# Patient Record
Sex: Female | Born: 1950 | Race: White | Hispanic: No | Marital: Married
Health system: Midwestern US, Academic
[De-identification: ages and names within clinical notes are randomized; demographics above are authoritative.]

---

## 2016-05-31 MED ORDER — FENTANYL CITRATE (PF) 50 MCG/ML IJ SOLN
25-50 ug | Freq: Once | INTRAVENOUS | 0 refills | Status: CP
Start: 2016-05-31 — End: ?

## 2016-05-31 MED ORDER — MIDAZOLAM 1 MG/ML IJ SOLN
1 mg | Freq: Once | INTRAVENOUS | 0 refills | Status: CP
Start: 2016-05-31 — End: ?

## 2016-05-31 MED ORDER — MIDAZOLAM 1 MG/ML IJ SOLN
0 refills | Status: CP
Start: 2016-05-31 — End: ?

## 2016-05-31 MED ORDER — FENTANYL CITRATE (PF) 50 MCG/ML IJ SOLN
0 refills | Status: CP
Start: 2016-05-31 — End: ?

## 2016-06-01 MED ORDER — ESOMEPRAZOLE MAGNESIUM 40 MG PO CPDR
ORAL_CAPSULE | Freq: Two times a day (BID) | 0 refills | 30.00000 days | Status: DC
Start: 2016-06-01 — End: 2016-06-29

## 2016-06-29 MED ORDER — ESOMEPRAZOLE MAGNESIUM 40 MG PO CPDR
ORAL_CAPSULE | Freq: Two times a day (BID) | 0 refills | 90.00000 days | Status: AC
Start: 2016-06-29 — End: ?

## 2016-08-11 ENCOUNTER — Encounter: Admit: 2016-08-11 | Discharge: 2016-08-11 | Payer: MEDICARE

## 2016-08-11 ENCOUNTER — Ambulatory Visit: Admit: 2016-08-11 | Discharge: 2016-08-12

## 2016-08-11 DIAGNOSIS — M329 Systemic lupus erythematosus, unspecified: Principal | ICD-10-CM

## 2016-08-11 DIAGNOSIS — K769 Liver disease, unspecified: ICD-10-CM

## 2016-08-11 DIAGNOSIS — M797 Fibromyalgia: ICD-10-CM

## 2016-08-11 NOTE — Telephone Encounter
Pt called to say she did not receive bariatric surgery info.

## 2016-08-11 NOTE — Progress Notes
Liver Transplant/Hepatology Clinic Nutrition Referral Note                                                     MD referral received on 08/11/16 for medical nutrition therapy services.    Met with pt in clinic.    Pt reported:    Transplant Liver Nutrition Questionnaire  Weight: 97.8 kg (215 lb 9.6 oz)  Have you recently lost weight without trying?: No  Have you noticed a decrease in muscle mass?: No  Have you been eating poorly due to decreased appetite?: No  Do you have any of the following conditions?: Overweight/Obesity    66 yo F with PMH of obesity, dermatitis, NAFLD per biopsy, diagnosed previously with lupus at OSH however [place] rheum has found negative work up for this. Pt also states she has a "sleep disorder".  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 RD was asked to meet with pt to discuss general healthy diet. She states she only eats once per day, but then with further questioning she admits that she eats crackers constantly throughout the day due to nausea and eats snacks such as cookie and pie. Her meals tend to be high calorie foods. Her nausea is likely driven by taking narcotic pain medications. Reviewed general healthy diet, encouraged 1/2 plate of non starchy vegetables, 1/4 plate of high fiber starches, 1/4 plate of lean protein. Encouraged water, coffee or unsweetened tea for drinks. Encouraged pt to avoid snacking on crackers all day, encouraged 3 well balanced meals daily. Pt voiced understanding, however she had a lot of barriers to making changes.  Time Spent with Patient (minutes): 15 minutes    Goals: reduced kcal intake, increased exercise.    Thornell Sartorius, MS, RD, LD  [phone]

## 2016-08-11 NOTE — Progress Notes
Date of Service: 08/11/2016    Subjective:             Holly Dixon is a 66 y.o. female.    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  She previously has a liver biopsy that showed evidence of sarcoidosis but this is not seen in the current liver biopsy.  Patient's also has other forms of dermatitis and has been followed by Dr. Berdie Ogren and there is no evidence of SLE or connective tissue disease.  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  It is uncertain if she also has obstructive sleep apnea may have contributed to her fatigue.  Metabolic risk factor also includes hypertension.  She does not have diabetes.  I have discussed with her about our treatment plan.  She is really not a good treatment candidate for the regenerate trial which would involve obeticholic acid.  Patient is already having a nonspecific dermatitis and it would be inpossible to distinguish the side effect of obeticholic acid as pruritus versus her dermatitis.  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  However patient does not have diabetes and insurance would not have paid for it.  Therefore the remaining option would involve a gastric sleeve surgery.  Patient expressed interest.  She will attend the seminar of  [place] [facility] and follow-up with [place] in 3 months.  In the meantime we will have our nutritionist talked to her today about the choose [email or web address] diet.       Review of Systems      Objective:         ??? CALCIUM CARBONATE (CALCIUM 600 PO) Take 1,200 mg by mouth daily.   ??? chlorzoxazone(+) (PARAFON FORTE) 500 mg tablet Take 500 mg by mouth four times daily as needed for Muscle Cramps.   ??? cholecalciferol (VITAMIN D-3) 1,000 units tablet Take 1,000 Units by mouth daily. ??? desipramine(+) (NORPRAMIN) 25 mg tablet Take 25 mg by mouth twice daily.   ??? DOCOSAHEXANOIC ACID/EPA (FISH OIL PO) Take 1,000 mg by mouth daily.   ??? esomeprazole DR(+) (NEXIUM) 40 mg capsule TAKE 1 CAPSULE BY MOUTH TWICE DAILY BEFORE MEALS. TAKE ON AN EMPTY STOMACH AT LEAST 1 HOUR BEFORE OR 2 HOURS AFTER FOOD   ??? FOLIC ACID/MULTIVIT-MIN/LUTEIN (CENTRUM SILVER PO) Take  by mouth daily.   ??? furosemide (LASIX) 20 mg tablet Take 20 mg by mouth as Needed.   ??? HYDROcodone/acetaminophen(+) (NORCO) 10/325 mg tablet Take 1 Tab by mouth every 6 hours as needed for Pain   ??? methadone (DOLOPHINE; METHADOSE) 10 mg tablet Take 10 mg by mouth three times daily   ??? nystatin (MYCOSTATIN) 100,000 units/mL oral suspension Take 500,000 Units by mouth twice daily as needed for Oral Thrush.   ??? potassium chloride SR (K-DUR) 20 mEq tablet Take 20 mEq by mouth daily. Take with a meal and a full glass of water.   ??? promethazine (PHENERGAN) 25 mg tablet Take 25 mg by mouth every 6 hours as needed for Nausea or Vomiting.   ??? triamcinolone acetonide (KENALOG) 0.1 % topical cream Apply  topically to affected area twice daily.     Vitals:    08/11/16 1015   BP: 145/77   Pulse: 78   Resp: 20  Temp: 36.8 ???C (98.3 ???F)   TempSrc: Oral   SpO2: 96%   Weight: 97.8 kg (215 lb 9.6 oz)   Height: 162.6 cm (64)     Body mass index is 37.01 kg/m???.     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  She previously has a liver biopsy that showed evidence of sarcoidosis but this is not seen in the current liver biopsy.  Patient's also has other forms of dermatitis and has been followed by Dr. Berdie Ogren and there is no evidence of SLE or connective tissue disease.  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  It is uncertain if she also has obstructive sleep apnea may have contributed to her fatigue. Metabolic risk factor also includes hypertension.  She does not have diabetes.  I have discussed with her about our treatment plan.  She is really not a good treatment candidate for the regenerate trial which would involve obeticholic acid.  Patient is already having a nonspecific dermatitis and it would be inpossible to distinguish the side effect of obeticholic acid as pruritus versus her dermatitis.  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  However patient does not have diabetes and insurance would not have paid for it.  Therefore the remaining option would involve a gastric sleeve surgery.  Patient expressed interest.  She will attend the seminar of  [facility] and follow-up with [place] in 3 months.  In the meantime we will have our nutritionist talked to her today about the choose [email or web address] diet. 35 min discussions.

## 2016-08-12 DIAGNOSIS — K7581 Nonalcoholic steatohepatitis (NASH): Principal | ICD-10-CM

## 2016-08-14 ENCOUNTER — Encounter: Admit: 2016-08-14 | Discharge: 2016-08-14 | Payer: MEDICARE

## 2016-08-14 NOTE — Progress Notes
Lab orders from clinic visit 08/11/16 faxed to Dr. Doloris HallPankaj Gugnani at [phone].

## 2016-08-14 NOTE — Telephone Encounter
Call returned to patient and provided [facility] website.  Shawn StallMichelle Finian Helvey RN BSN

## 2016-11-03 ENCOUNTER — Encounter: Admit: 2016-11-03 | Discharge: 2016-11-03 | Payer: MEDICARE

## 2016-11-03 DIAGNOSIS — M797 Fibromyalgia: ICD-10-CM

## 2016-11-03 DIAGNOSIS — R748 Abnormal levels of other serum enzymes: Secondary | ICD-10-CM

## 2016-11-03 DIAGNOSIS — M329 Systemic lupus erythematosus, unspecified: Principal | ICD-10-CM

## 2016-11-03 DIAGNOSIS — K769 Liver disease, unspecified: ICD-10-CM

## 2016-11-03 NOTE — Progress Notes
Date of Service: 11/03/2016    Subjective:             Holly Dixon is a 66 y.o. female.    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   ???  Her initial history is outlined in consultation note of 05/18/2015 when she was seen at the request of Dr. Dorothey Baseman in hepatology clinic for history of lupus in the setting of liver disease.  As per initial consultation note:   The patient stated that while in [facility] about 7-8 years ago, she developed diffuse dermatitis over her face and extremities and trunk and felt sick and nauseous.??? She was found to have elevated liver enzymes.??? Apparently she was evaluated by gastrologist her hepatologist and her primary care physician.??? She was subsequently referred to rheumatologist Dr. Betti Cruz.??? Per patient's report, she also had skin biopsy that was abnormal and together with her labs she was diagnosed with lupus.??? She was started on prednisone which she did not tolerate well.??? She denies ever been on Plaquenil or chloroquine.  She was diagnosed with fibromyalgia with symptoms of mild diffuse myalgia and tenderness and fatigue which have persisted for this she was treated with Cymbalta and Lyrica which again she did not tolerated well and ended up hospital which may have affected her liver enzymes as well patient's recollection.??? She has been treated with hydrocodone and methadone to help with her fibromyalgia.??? This is being refilled by her primary care physician Dr. Hulan Fess.  It appears that somehow after several liver biopsies she may have thought of having possibly hepatic sarcoidosis was started on Imuran in January 2016.??? She has had ERCPs and pancreatitis.??? Patient is not sure about the details of her history in this regards.  She was seen by Dr. Dorothey Baseman in hepatology clinic in October 2016.??? There is no available labs  EMR but apparently patient has been getting monitoring labs for Imuran every 3 months through her primary care physician that are communicated with Dr. Dorothey Baseman.  She denies any depression or anxiety.??? She does not use any sunscreen.??? She has faint erythema confluently over her upper extremities and chest and face and appearance of malar rash.??? She has not seen a dermatologist for a while and after she moved to [facility] in 2016.  Patient denies any history of strokes, seizures, thromboembolic events, or miscarriages.??? No history of inflammatory eye disease or idiopathic hearing loss.??? No sicca or mucositis or dysphagia, abdominal pain, hematochezia, or melena.?????? No history of hemoptysis, cough, or current chest pain, or any known serositis. No history of nephritis or Raynaud's phenomenon.??? No associated rash or photosensitivity. No history of psoriasis or inflammatory bowel disease.??? No paresthesia, neuropathy, radiculopathy, or frank weakness.??? No history of fevers, night sweats or unintentional weight loss.  She denies any history of Tobacco or alcohol use.??? 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 There is no family history of connective tissue disease.  I reviewed the outside records that were scanned into EMR.??? ???  Liver biopsy November 26, 2006 showed noncaseating granulomatous inflammation and other stains were negative.??? The differential diagnosis included infectious agent, sarcoidosis versus a drug  reaction.??? ???  The TPMT level was normal in November 2015.  The notes of rheumatologist Dr. Betti Cruz included diagnoses of fibromyalgia, bursitis of hip, and elevated liver enzyme with negative ANA, liver sarcoidosis It appears that she was indeed on Plaquenil 2009 and early 2010 although the patient could not remember this.Marland Kitchen??? By 2012 her diagnoses included sarcoidosis, Raynaud's syndrome, bursitis of hip, fibromyalgia, and elevated liver enzymes.??? She was no longer on Plaquenil.??? She was on alendronate.??? By 2014 and 2015 she had developed erythema and facial erythema.??? It appears that by 2015 diagnosis of lupus was added to her list of diagnoses.??? One note of her current primary care physician in June 2016 listed diagnoses of chronic pain syndrome, fatty liver disease, nonalcoholic, chronic discoid lupus erythematosus, and elevated liver enzymes.  There was no skin biopsy report.  ??????  ??????  Interval History:    She developed a left pulmonary embolism in 09/2016 after a fall and injuring her left knee. She was a started on warfarin.  She has remained off of azathioprine for a long time as she couldn't tolerated previously.   She continues to complain of diffuse myalgia and arthralgia and tenderness and fatigue. ???This is a stable. The fibromyalgia is being managed by her primary care physician. ???  She has had diffuse paresthesia and tingling and burning sensation of her skin with associated pain chronically. ???  The chronic dermatitis mostly in her face and upper arms have remained stable. ???These apparently have not necessarily photosensitive.  She has had cyanotic or purplish discoloration of the fingers and toes but not necessarily cold induced.  She has had a chronic dyspnea. ???  No constitutional symptoms such as fever, night sweats or weight loss.   ???     Review of Systems   Constitutional: Positive for fatigue.   HENT: Positive for tinnitus.    Respiratory: Positive for shortness of breath.    Gastrointestinal: Positive for nausea.   Musculoskeletal: Positive for arthralgias, myalgias and neck pain.   Neurological: Positive for numbness.   All other systems reviewed and are negative.        Objective: ??? CALCIUM CARBONATE (CALCIUM 600 PO) Take 1,200 mg by mouth daily.   ??? chlorzoxazone(+) (PARAFON FORTE) 500 mg tablet Take 500 mg by mouth four times daily as needed for Muscle Cramps.   ??? cholecalciferol (VITAMIN D-3) 1,000 units tablet Take 1,000 Units by mouth daily.   ??? desipramine(+) (NORPRAMIN) 25 mg tablet Take 25 mg by mouth twice daily.   ??? DOCOSAHEXANOIC ACID/EPA (FISH OIL PO) Take 1,000 mg by mouth daily.   ??? esomeprazole DR(+) (NEXIUM) 40 mg capsule TAKE 1 CAPSULE BY MOUTH TWICE DAILY BEFORE MEALS. TAKE ON AN EMPTY STOMACH AT LEAST 1 HOUR BEFORE OR 2 HOURS AFTER FOOD   ??? FOLIC ACID/MULTIVIT-MIN/LUTEIN (CENTRUM SILVER PO) Take  by mouth daily.   ??? furosemide (LASIX) 20 mg tablet Take 20 mg by mouth as Needed.   ??? HYDROcodone/acetaminophen(+) (NORCO) 10/325 mg tablet Take 1 Tab by mouth every 6 hours as needed for Pain   ??? methadone (DOLOPHINE; METHADOSE) 10 mg tablet Take 10 mg by mouth three times daily   ??? nystatin (MYCOSTATIN) 100,000 units/mL oral suspension Take 500,000 Units by mouth twice daily as needed for Oral Thrush.   ??? potassium chloride SR (K-DUR) 20 mEq tablet Take 20 mEq by mouth daily. Take with a meal and a full glass of water.   ??? promethazine (PHENERGAN) 25 mg tablet Take 25  mg by mouth every 6 hours as needed for Nausea or Vomiting.   ??? triamcinolone acetonide (KENALOG) 0.1 % topical cream Apply  topically to affected area twice daily.   ??? warfarin (COUMADIN) 5 mg tablet Take 5 mg by mouth daily.     Vitals:    11/03/16 1017   BP: 140/75   Pulse: 77   Resp: 18   Temp: 36.9 ???C (98.5 ???F)   TempSrc: Oral   SpO2: 97%   Weight: 95.7 kg (211 lb)   Height: 162.6 cm (64)     Body mass index is 36.22 kg/m???. Discussed patient's BMI with her.  The body mass index is 36.22 kg/m???. and falls within the category of Obesity 2 (35 to <40); specialist visit only, referred back to Primary Care Provider for follow up.    Physical Exam   Constitutional: She does not appear ill. No distress.   HENT: Mouth/Throat: Oropharynx is clear and moist and mucous membranes are normal.   Eyes: Conjunctivae are normal.   Neck: Normal range of motion.   Cardiovascular: Normal rate, regular rhythm, normal heart sounds and intact distal pulses.    Pulmonary/Chest: Effort normal and breath sounds normal.   Musculoskeletal:   No synovitis of any joints upper and lower extremities bilaterally. No dactylitis. ROM of joints WFL.  She has diffuse soft tissue point tenderness of fibromyalgia bilaterally.     Lymphadenopathy:     She has no cervical adenopathy.   Neurological: She is alert. She has normal strength.   No weakness of the proximal and distal upper and lower extremities and the cervical muscles.   Skin: No rash noted.   She has facial erythema including maxillary rash that spares the nasolabial fold bilaterally.  The erythema is confluent and macular.  It involves her forehead less intensely.  She has more erythematous violaceous confluent macular dermatitis of arms bilaterally and mildly of trunk. No sclerodactyly or sclerosis elsewhere, telangectasia, or calcinosis. No heliotrope rash, Gottron's papules, Shawl or V sign.  No livedo reticularis. Nailfold capillaroscopy negative.  No digital ulcerations.   Psychiatric: She has a normal mood and affect. Her speech is normal and behavior is normal.            Assessment and Plan:    1. Fibromyalgia    2. Elevated liver enzymes    3. Chronic liver disease    4. Dermatitis    5. Eosinophilic esophagitis    6. Reflux esophagitis    7. Numbness and tingling sensation of skin    8. Chronic neck pain    9. Chronic fatigue    10. Peripheral cyanosis    11. Dyspnea, unspecified type      ???  ???    This has been a complex case of a chronic disease process with facial erythema suggestive of malar rash???which by biopsy did not appear to be related to underlying connective tissue disease but suggestive of sun damaged skin, as well as faint erythema of the trunk and upper extremities in the setting of prior diagnosis of SLE (although the details are not clear) and not confirmed by further evaluation at [facility], and chronic severe fibromyalgia or chronic pain disorder. It appears that she was previously on hydroxychloroquine???and later she was started on chloroquine.??? Per patient, she did not tolerate the prednisone either but the details of this are not clear.??? The patient did not describe other features of a connective tissue disease.  ???  She has had  liver disease with noncaseating granulomatous inflammation by pathology for which a diagnosis of hepatic sarcoidosis???was entertained and therefore she was started on azathioprine.??? ???  ???  The patient has had a chronic diagnosis of fibromyalgia.??? She has remained on pain medications and continues to have diffuse myalgia and tenderness and fatigue.??? She did not tolerate medications such as Cymbalta or Lyrica.  ???  I strongly advised her to follow son protection measures, avoid excessive sun exposure, apply sunscreen, and wear long sleeves and pants and hat.  ???  She has had dysphagia and occasional nausea and diarrhea chronically.    ???  She followed???in hepatology clinicto evaluate the underlying liver disease and any further management.  ???  She remainwd on azathioprine with monitoring labs done through her primary care physician, Dr. Hulan Fess and communicated with hepatology clinic. The patient discontinued the azathioprine apparently on her own as she could not tolerated it later.  ???  Labs from 05/18/15 showed mild thrombocytosis, mildly elevated alkaline phosphatase, Aldolase of 12.8, ANA 160, and urinalysis with leukocytosis but without proteinuria. The remainder of the labs including ???autoimmune serologies, ACE level and vitamin D and 1,25 vitamin D, Hepatitis B and C serologies, and TPMT are normal or negative.   ???  The chest x-ray 05/18/15 showed mild right hemidiaphragmatic elevation without acute cardiopulmonary process without report of hilar adenopathy.  ???  CBC in 4/20167 showed mild eosinophilia and otherwise was negative. ???She did not have eosinophilia on her CBC in March 2017. The CMP was negative with only mildly elevated alkaline phosphatase. ???She follows???in hepatology clinic and this can be addressed there by Dr. Shea Evans. ???The remainder of the liver enzymes were normal. ???Normal or negative TPO, and repeat aldolase and ANA. ???The urinalysis showed leukocytes esterases and WBCs but reflex urine culture was basically negative. ???  Negative Myomarker panel III.  As there were no clear evidence of myositis I did???not proceed with EMG.???  Therefore, her autoimmune serologies were not consistent with her outside diagnosis of SLE.   ???  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   I noted that the skin biopsy in dermatology clinic here was consistent with poikiloderma, etiology of which can be that of sun damage.  We received skin punch biopsy slides 05/08/13 and liver biopsy 05/09/2011 slides and reports. Reports were???scanned to [facility], slides taken to [facility] for confirmation of diagnosis.  ???  The outside dermatopathology report from 05/08/2013 done at [facility] showed the following:  Right nasal sidewall skin biopsy: Perivascular chronic inflammation with telangiectasias, no interface dermatitis identified, no spongiotic dermatitis identified, no fungus identified on special stain.  Direct immunofluorescence of right distal posterior upper arm skin: Nonspecific direct immunofluorescent findings with very small amount of IgM present dermoepidermal, junction non-confirmatory lupus, no evidence of immune mediated vesiculobolus disorder. ???  Comment: The histologic findings are mild and nonspecific with mild perivascular chronic inflammation and mild telangiectatic changes within blood vessels. ???No interface dermatitis, dense perifollicular inflammation, increased dermal mucin, epidermal atrophy, or other evidence of lupus is identified. ???Non-granulomatous rosacea might show these features. ???Some type of dermal hypersensitivity reaction should also be considered. ???Eczema and related conditions usually show more epidermal spongiosis.    Outside skin biopsy was reviewed at [facility]:   Right nasal sidewall: Photodamaged skin with epidermal atrophy and telangiectasias.   Comment: The biopsy is negative for the inflammatory interface dermatitis of an  autoimmune disease.   A second outside biopsy reviewed at [place] was that of liver:   Architecturally ???normal liver. Mild macrovesicular steatosis (20%). Mild focal portal nonspecific infiltrates of lymphocytes and scattered eosinophils.   Comment: These findings are nonspecific. No evidence of an active primary or secondary liver disease.     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 ???     So, in summary, the skin biopsy reports from the past and currently were not suggestive of lupus or a connective tissue disease and the repeat ANA was negative. ???  ???  Echocardiogram 08/31/15 was normal with normal left ventricular systolic function. EF~ 60% without significant valvular stenosis or regurgitation, or significant pericardial effusion.  Normal pulmonary function tests 08/31/15.  ???  She was seen???in???GI clinic for dysphagia and nausea. She stated???this was looked into by a gastroenterologist elsewhere in the past but a diagnosis was not made after an EGD.  Normal gastric emptying test 08/20/15.  However, it appears that she was diagnsoed with reflux esophagitis???and eosinophilic esophagitis???after EGD and biopsy, which may explain the eosinophilia.  Noted and appreciate evaluation by Dr. Eliott Nine in GI clinic who esophageal biopsy felt that her symptoms are more in favor of eosinophilic esophagitis versus reflux esophagitis and continue 6 diet elimination and referral to allergy clinic and the diet as well as follow-up GI clinic and continue with PPI therapy twice daily. ???  ???  Joint Survey x-rays 7/20017 showed:  1. ???No erosions. No evidence of inflammatory arthropathy.  2. ???Osteoarthritis of the cervical facet joints, medial joint space right knee, and the bilateral hands as described.  3. ???4 mm anterolisthesis of C3 on C4. Consider obtaining lateral flexion-extension x-rays of the cervical spine for further evaluation. Solid interbody fusion C4-C7.  4. 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  ???  I advised her to see her spine surgeon if she has neck pain so she can proceed with additional imaging of cervical spine including flexion-extension views.  ???  B12 and folate were not low and B12 was elevated which could be the case if she is taking B12 supplements and can reduce the dose or address through PCP.  ???  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 They pending ACE level was still mildly elevated at 58. ???She was on Imuran for history of hepatic sarcoidosis.  ???  Noted and appreciate evaluation in Neurology by Dr. Meredeth Ide. The ???EMG showed evidence of bilateral mild carpal tunnel syndrome and left C7 radiculopathy and she was scheduled for MRI of the cervical spine. ???The plan was to complete her neurologic workup and based on the findings consider skin biopsy for small fiber neuropathy.  ??? I also noted that hepatology clinic has dismissed her from hepatology clinic. ???She had remained on Imuran for sarcoidosis.???  I inquired to see if she will be followed in hepatology clinic or not. ???  She was referred to pulmonary clinic. ???  ???  I advised her against taking high-dose vitamin D supplementation. ???She may take [id number] international units daily instead if needed.  ???  I had refer her to Pulmonary Sarcoidosis Clinic and  she was seen by Dr. Nicholes Rough for dyspnea.???She has not had any known history of pulmonary sarcoidosis.    C-spine x-rays showed stability obtained in anticipation of Spine Clinic consultation:  1. ???Lateral flexion view shows 4 mm anterolisthesis of C3 on C4. This is similar to prior flexion view of September 09, 2015. On the extension view the degree of anterolisthesis diminishes to 2.8 mm. This indicates some mild increased mobility at C3-C4. Slight anterolisthesis C7 on T1. 2. ???Solid ACDF changes C4-C7 with intact anterior plate screw fixation.  ???  Labs 05/16/16 showed normal or stable CBC with mild erythrocytosis, normal inflammatory markers ESR and CRP. Persistently elevated alkaline phosphatase of 176 mildly elevated ALT of 58 and otherwise normal CMP with normal rate renal function. ???Normal vitamin D at 61.5 and vitamin D 1,25. ???Negative urinalysis with only trace leukocytes. INR normal at 1.0 likely ordered by hepatology clinic.  ???  She has fibromyalgia. I have not been able to confirm a diagnosis of connective tissue disease such as systemic lupus erythematosus or antiphospholipid antibody syndrome or cryoglobulinemia. ???    She reported that she had not taken Azathioprine for a long time but she thought she was taking it. She went back and checked and noticed that this was the medication she couldn't tolerate and had been off of it.   ???  So, she has not been on Imuran for history of hepatic sarcoidosis. She???was seen in Hepatology in 11/2014 and then followed with Dr. Shea Evans in 04/2016 and is scheduled to undergo another liver biopsy.     I referred her to spine Clinic for her chronic neck pain and prior surgery. She was seen by Dr. Samara Deist on 06/05/16. He performed trigger point injections in the posterior cervical region.    I referred her to vascular medicine Clinic to see Dr. Chales Abrahams for a question of vasculopathy of upper and lower extremities due to episodic cyanotic discoloration of fingers and toes. 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     I noted the liver biopsy on 05/31/16 was not consistent with hepatic sarcoidosis but minimally active steatohepatitis with steatosis.    She planed to follow in???Dermatology for dermatitis or sun damage skin. The biopsies were not consistent with lupus but poikiloderma.  I have strongly???advised her on sun protection principals, use of sun screen, wear a large hat to protect the face, and long sleeves and pants.    She was referred to Allergy Clinic and patient had to cancel so it was postponed to 07/31/16.    She was to follow with Dr. Meredeth Ide about need for skin biopsy for small fiber neuropathy. She reported not only diffuse pain but also diffuse burning sensation of skin. I wonder if she could have small fiber neuropathy.    She follows with an Ophthalmologist.    ???  Results and Plan:     Please proceed with labs call us or contact us through MyChart for results.    She had a left pulmonary embolism in 09/2016 after a fall and injuring her left knee. She is on warfarin.  ???  The patient has been instructed to keep body and extremities warm, wear mittens, and avoid digital injury.  ???  She is being managed for fibromyalgia and routine care by her PCP, Dr. Hulan Fess. She remains on Chlorozoxazone (and not Chloroquine) which was started by another physician for he fibromyalgia per patient. She remains on methadone and hydrocodone.  ???  I  have advised her against taking high-dose vitamin D supplementation. ???She may take up to [phone] international units daily instead if needed.    She will be following with Dr. Shea Evans next month.  ???  She is intolerant of influenza vaccination and does not take it.  ???  She reported that she had both Prevnar and Pneumovax 23 through her PCP. She stated she was updated on her pneumonia vaccinations in 09/2016.  ???  Since a clear rheumatologic diagnosis has not been confirmed, she may follow with her other physicians and return to Rheumatology Clnic as needed.  ???  I answered questions. Patient voiced understanding of the above discussion and agreement with plans.  ???  Vladimir Crofts, MD    ???  ADDENDUM:    I noted that patient was seen in hepatology clinic after the labs revealed persistent elevated liver enzymes.  She will follow this in hematology clinic and through her primary care physician.    Vladimir Crofts, MD

## 2016-11-04 ENCOUNTER — Ambulatory Visit: Admit: 2016-11-03 | Discharge: 2016-11-04 | Payer: Private Health Insurance - Indemnity

## 2016-11-04 DIAGNOSIS — K210 Gastro-esophageal reflux disease with esophagitis: ICD-10-CM

## 2016-11-04 DIAGNOSIS — R202 Paresthesia of skin: ICD-10-CM

## 2016-11-04 DIAGNOSIS — R0600 Dyspnea, unspecified: ICD-10-CM

## 2016-11-04 DIAGNOSIS — L309 Dermatitis, unspecified: ICD-10-CM

## 2016-11-04 DIAGNOSIS — R200 Anesthesia of skin: Secondary | ICD-10-CM

## 2016-11-04 DIAGNOSIS — R230 Cyanosis: ICD-10-CM

## 2016-11-04 DIAGNOSIS — R5382 Chronic fatigue, unspecified: ICD-10-CM

## 2016-11-04 DIAGNOSIS — M797 Fibromyalgia: Principal | ICD-10-CM

## 2016-11-04 DIAGNOSIS — K200 Eosinophilic esophagitis: ICD-10-CM

## 2016-11-04 DIAGNOSIS — M542 Cervicalgia: ICD-10-CM

## 2016-11-04 DIAGNOSIS — G8929 Other chronic pain: ICD-10-CM

## 2016-11-10 ENCOUNTER — Encounter: Admit: 2016-11-10 | Discharge: 2016-11-10 | Payer: MEDICARE

## 2016-11-10 NOTE — Telephone Encounter
Pt lvm asking for lab orders to be faxed to her PCP Dr. Hulan Fess so she can have then done.     Called pt back as she had asked and confirmed.     Faxed labs to [phone]

## 2016-11-10 NOTE — Telephone Encounter
Call returned to patient.  Patient requests lab orders faxed to PCP to have completed there.  Orders faxed to [phone].  Shawn Stall RN BSN

## 2016-11-10 NOTE — Telephone Encounter
Please return a call to [facility] regarding lab orders for local location.

## 2016-11-14 LAB — C REACTIVE PROTEIN (CRP)

## 2016-11-14 LAB — SED RATE

## 2016-11-14 LAB — URINALYSIS, MICROSCOPIC

## 2016-11-16 ENCOUNTER — Encounter: Admit: 2016-11-16 | Discharge: 2016-11-16 | Payer: MEDICARE

## 2016-11-16 DIAGNOSIS — K7581 Nonalcoholic steatohepatitis (NASH): Principal | ICD-10-CM

## 2016-11-17 ENCOUNTER — Encounter: Admit: 2016-11-17 | Discharge: 2016-11-17 | Payer: MEDICARE

## 2016-11-17 DIAGNOSIS — K7581 Nonalcoholic steatohepatitis (NASH): Principal | ICD-10-CM

## 2016-11-17 LAB — COMPREHENSIVE METABOLIC PANEL
Lab: 0.4 mg/dL (ref <=12)
Lab: 0.7 mg/dL (ref 0.51–0.95)
Lab: 103 mmol/L (ref 98–107)
Lab: 11 mg/dL (ref 8–23)
Lab: 142 mmol/L (ref 136–145)
Lab: 15 mmol/L (ref 4–20)
Lab: 181 U/L — ABNORMAL HIGH (ref 10–35)
Lab: 223 U/L — ABNORMAL HIGH (ref 35–104)
Lab: 24 mmol/L (ref 22–29)
Lab: 4.3 mmol/L (ref 3.5–5.1)
Lab: 4.4 g/dL (ref 3.5–5.2)
Lab: 76 U/L — ABNORMAL HIGH (ref <=33)
Lab: 9.4 mg/dL (ref 8.8–10.2)
Lab: 90 mg/dL (ref 74–99)
Lab: >60 10*3/uL (ref >=60)
Lab: >60 mL/min/{1.73_m2} (ref >=60)

## 2016-11-17 LAB — PROTIME INR (PT)
Lab: 2.3 U/L (ref 2.0–3.0)
Lab: 22 s — ABNORMAL HIGH (ref 9.6–11.1)

## 2016-11-17 LAB — ALPHA FETO PROTEIN (AFP): Lab: 6 — ABNORMAL HIGH

## 2016-11-17 LAB — CBC AND DIFF: Lab: 7 10*3/uL (ref 6–11.1)

## 2016-11-22 ENCOUNTER — Encounter: Admit: 2016-11-22 | Discharge: 2016-11-22 | Payer: MEDICARE

## 2016-11-22 DIAGNOSIS — K769 Liver disease, unspecified: ICD-10-CM

## 2016-11-22 DIAGNOSIS — M797 Fibromyalgia: Principal | ICD-10-CM

## 2016-11-22 DIAGNOSIS — D8689 Sarcoidosis of other sites: ICD-10-CM

## 2016-11-23 ENCOUNTER — Encounter: Admit: 2016-11-23 | Discharge: 2016-11-23 | Payer: MEDICARE

## 2016-11-30 ENCOUNTER — Encounter: Admit: 2016-11-30 | Discharge: 2016-11-30 | Payer: MEDICARE

## 2016-11-30 DIAGNOSIS — K769 Liver disease, unspecified: ICD-10-CM

## 2016-11-30 DIAGNOSIS — D8689 Sarcoidosis of other sites: ICD-10-CM

## 2016-11-30 DIAGNOSIS — M797 Fibromyalgia: Principal | ICD-10-CM

## 2016-12-01 ENCOUNTER — Encounter: Admit: 2016-12-01 | Discharge: 2016-12-01 | Payer: MEDICARE

## 2016-12-01 DIAGNOSIS — K769 Liver disease, unspecified: ICD-10-CM

## 2016-12-01 DIAGNOSIS — K7581 Nonalcoholic steatohepatitis (NASH): ICD-10-CM

## 2016-12-01 DIAGNOSIS — D8689 Sarcoidosis of other sites: ICD-10-CM

## 2016-12-01 DIAGNOSIS — M797 Fibromyalgia: Principal | ICD-10-CM

## 2016-12-22 ENCOUNTER — Encounter: Admit: 2016-12-22 | Discharge: 2016-12-22 | Payer: MEDICARE

## 2016-12-22 ENCOUNTER — Ambulatory Visit: Admit: 2016-12-22 | Discharge: 2016-12-23 | Payer: Private Health Insurance - Indemnity

## 2016-12-22 DIAGNOSIS — M329 Systemic lupus erythematosus, unspecified: Principal | ICD-10-CM

## 2016-12-22 DIAGNOSIS — K769 Liver disease, unspecified: ICD-10-CM

## 2016-12-22 DIAGNOSIS — M797 Fibromyalgia: ICD-10-CM

## 2016-12-22 DIAGNOSIS — K760 Fatty (change of) liver, not elsewhere classified: Principal | ICD-10-CM

## 2016-12-22 NOTE — Progress Notes
Date of Service: 12/22/2016    Subjective:             Holly Dixon is a 66 y.o. female.    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  She previously has a liver biopsy that showed evidence of sarcoidosis but this is not seen in the current liver biopsy.  Patient's also has other forms of dermatitis and has been followed by Dr. Berdie Ogren and there is no evidence of SLE or connective tissue disease.  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  It is uncertain if she also has obstructive sleep apnea may have contributed to her fatigue.  Metabolic risk factor also includes hypertension.  She does not have diabetes.  I have discussed with her about our treatment plan.  Patient was previously considering a gastric sleeve surgery and we gave her 6 months of time to attend the seminar for gastric sleeve surgery but apparently she was not interested. 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  However patient does not have diabetes and insurance would not have paid for it.  Also considered with that she is a good candidate for the regenerate trial.  Patient has skin erythema but she does not have pruritus specifically and this should not cause confusion with the side effect of obeticholic acid patient lives 1 hour from here and she is actually quite interested.  Patient recently had a pulmonary embolism and has been on Coumadin for 2 months already.  Patient is under the care of Dr.Gugua and will decide how longni will take to come off Coumadin         Review of Systems  Review of systems as above and per History of Present Illness; otherwise negative for 10 of 14 systems reviewed.    Objective:         ??? CALCIUM CARBONATE (CALCIUM 600 PO) Take 1,200 mg by mouth daily. ??? chlorzoxazone(+) (PARAFON FORTE) 500 mg tablet Take 500 mg by mouth four times daily as needed for Muscle Cramps.   ??? cholecalciferol (VITAMIN D-3) 1,000 units tablet Take 1,000 Units by mouth daily.   ??? desipramine(+) (NORPRAMIN) 25 mg tablet Take 25 mg by mouth twice daily.   ??? DOCOSAHEXANOIC ACID/EPA (FISH OIL PO) Take 1,000 mg by mouth daily.   ??? esomeprazole DR(+) (NEXIUM) 40 mg capsule TAKE 1 CAPSULE BY MOUTH TWICE DAILY BEFORE MEALS. TAKE ON AN EMPTY STOMACH AT LEAST 1 HOUR BEFORE OR 2 HOURS AFTER FOOD   ??? FOLIC ACID/MULTIVIT-MIN/LUTEIN (CENTRUM SILVER PO) Take  by mouth daily.   ??? furosemide (LASIX) 20 mg tablet Take 20 mg by mouth as Needed.   ??? HYDROcodone/acetaminophen(+) (NORCO) 10/325 mg tablet Take 1 Tab by mouth every 6 hours as needed for Pain   ??? methadone (DOLOPHINE; METHADOSE) 10 mg tablet Take 10 mg by mouth three times daily   ??? nystatin (MYCOSTATIN) 100,000 units/mL oral suspension Take 500,000 Units by mouth twice daily as needed for Oral Thrush.   ??? potassium chloride SR (K-DUR) 20 mEq tablet Take 20 mEq by mouth daily. Take with a meal and a full glass of water.   ??? promethazine (PHENERGAN) 25 mg tablet Take 25 mg by mouth every 6 hours as needed for Nausea or Vomiting.   ???  triamcinolone acetonide (KENALOG) 0.1 % topical cream Apply  topically to affected area twice daily.   ??? warfarin (COUMADIN) 5 mg tablet Take 5 mg by mouth four times weekly. 2 1/2 three days a week     Vitals:    12/22/16 1443   BP: 142/69   Pulse: 81   Resp: 18   Temp: 36.6 ???C (97.9 ???F)   TempSrc: Oral   SpO2: 94%   Weight: 93.7 kg (206 lb 9.6 oz)   Height: 162.6 cm (64)     Body mass index is 35.46 kg/m???.     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  She previously has a liver biopsy that showed evidence of sarcoidosis but this is not seen in the current liver biopsy. Patient's also has other forms of dermatitis and has been followed by Dr. Berdie Ogren and there is no evidence of SLE or connective tissue disease.  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  It is uncertain if she also has obstructive sleep apnea may have contributed to her fatigue.  Metabolic risk factor also includes hypertension.  She does not have diabetes.  I have discussed with her about our treatment plan.  Patient was previously considering a gastric sleeve surgery and we gave her 6 months of time to attend the seminar for gastric sleeve surgery but apparently she was not interested. 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  However patient does not have diabetes and insurance would not have paid for it.  Also considered with that she is a good candidate for the regenerate trial.  Patient has skin erythema but she does not have pruritus specifically and this should not cause confusion with the side effect of obeticholic acid patient lives 1 hour from here and she is actually quite interested.  Patient recently had a pulmonary embolism and has been on Coumadin for 2 months already.  Patient is under the care of Dr.Gugua and will decide how longni will take to come off Coumadin    Patient has received the consent form for the regenerate study.  We are going to time the study enrollment to the timing of patient coming off Coumadin after DVT and PE.  Otherwise patient will follow-up with [place] on a yearly basis.

## 2017-02-12 NOTE — Progress Notes
Patient to enroll in trial.

## 2017-03-23 ENCOUNTER — Encounter: Admit: 2017-03-23 | Discharge: 2017-03-23 | Payer: MEDICARE

## 2017-04-15 IMAGING — US MAMMO BREAST US LEFT LTD
1 series · 14 of 15 positions shown · non-contrast
Comparison: Ultrasound May 10, 2015

Targeted right breast diagnostic ultrasound  11/22/2015 [DATE]
HISTORY: Patient is six-month follow-up evaluation for left breast
cyst

[Series 1: mammo breast us left ltd · 14 of 15 slices shown]
[im 1/15]
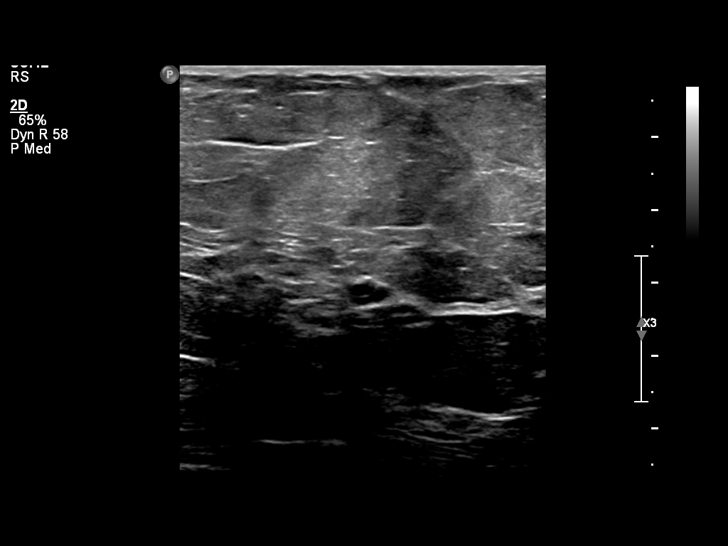
[im 2/15]
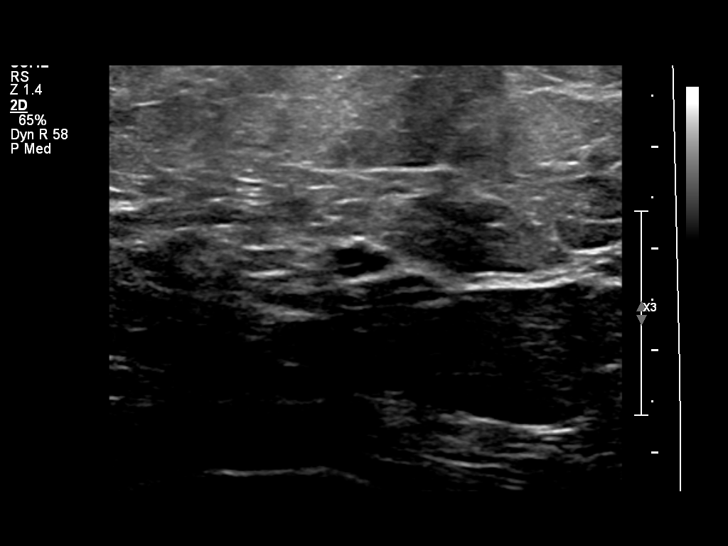
[im 3/15]
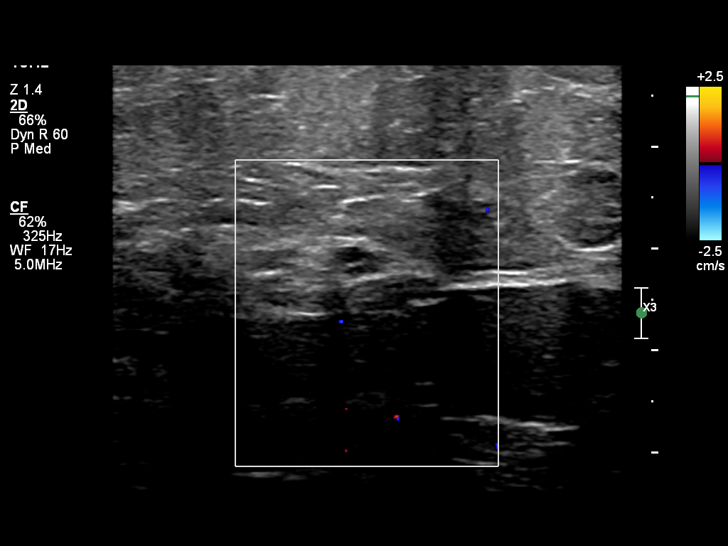
[im 4/15]
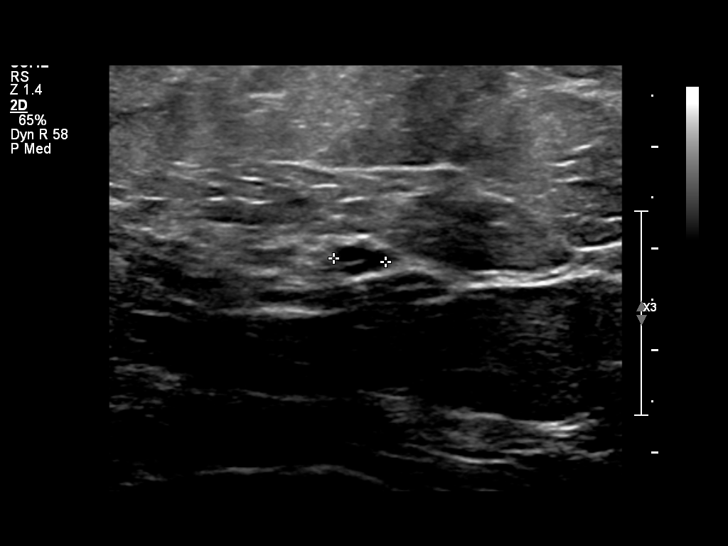
[im 5/15]
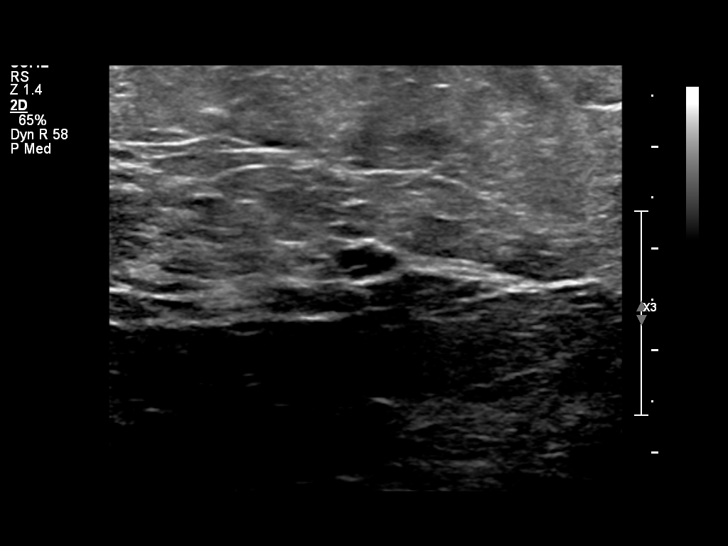
[im 6/15]
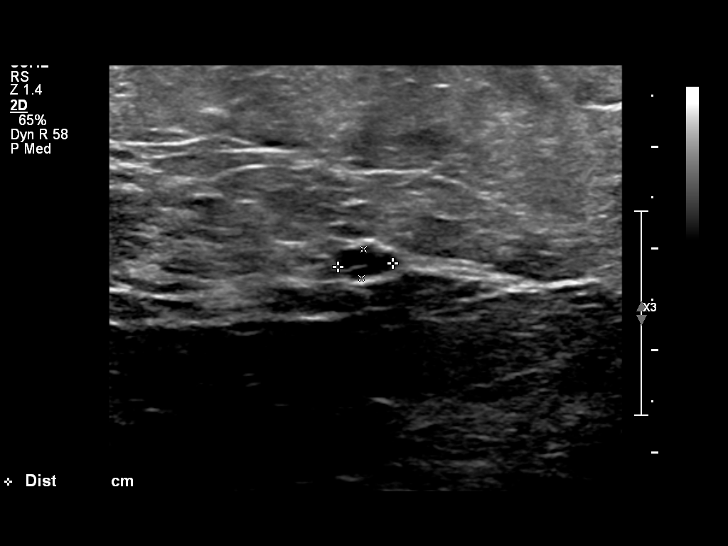
[im 7/15]
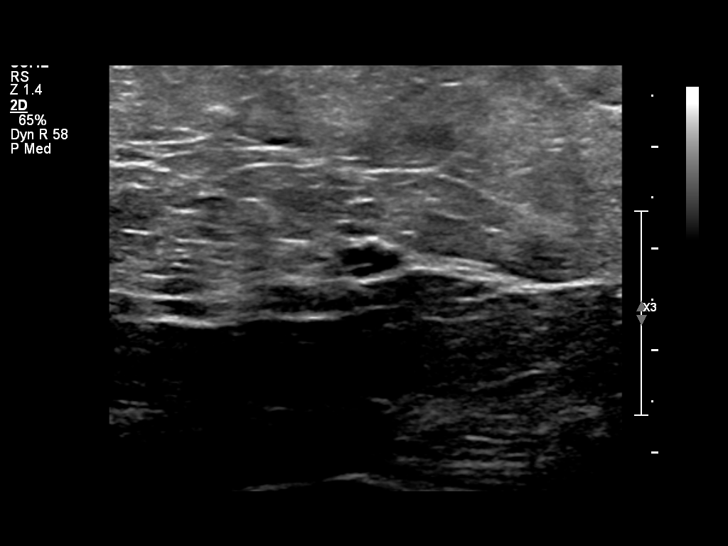
[im 9/15]
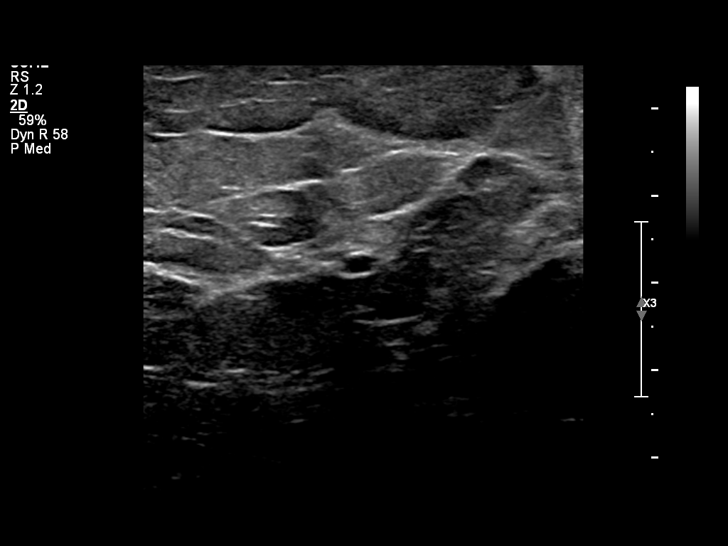
[im 10/15]
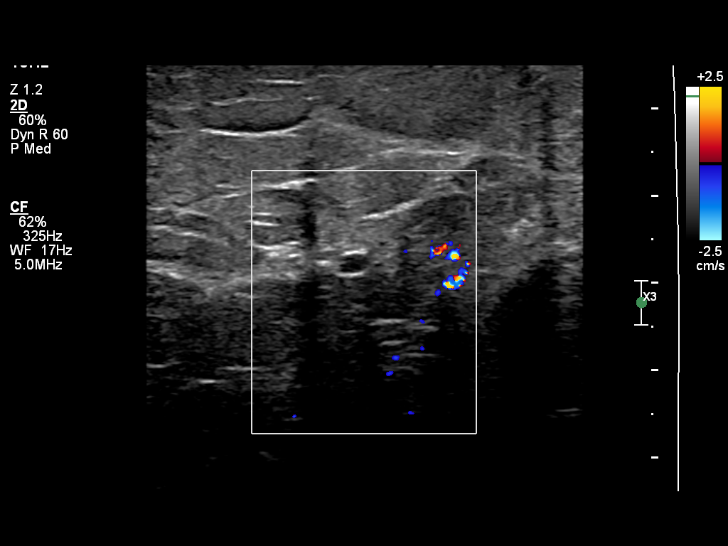
[im 11/15]
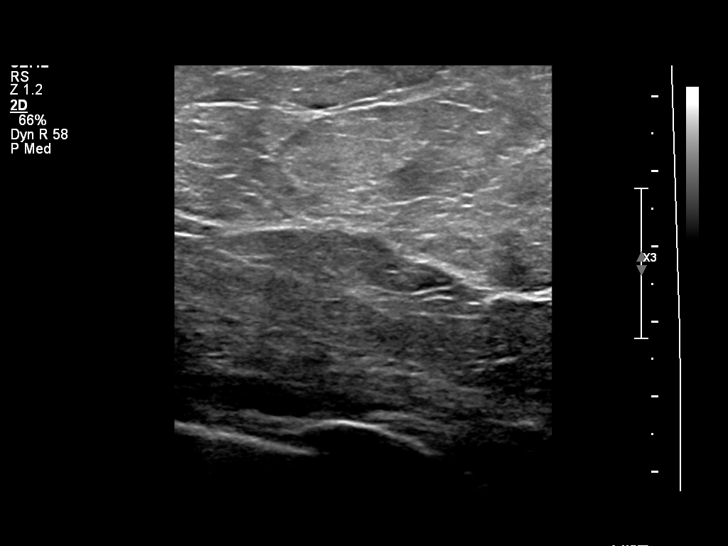
[im 12/15]
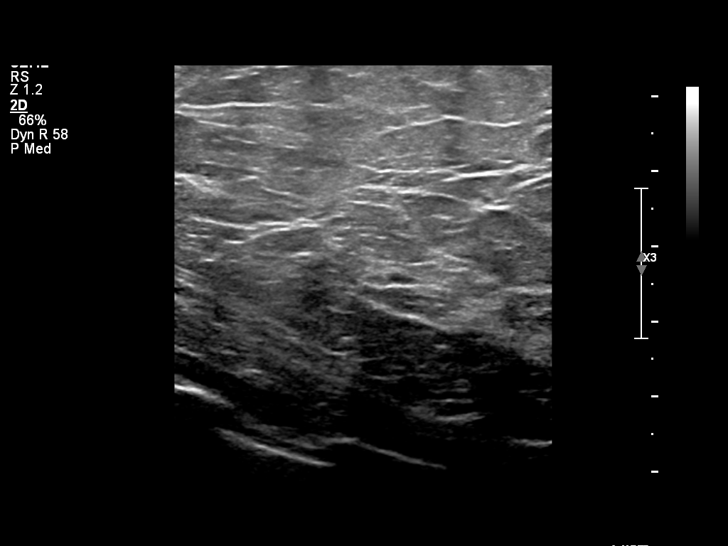
[im 13/15]
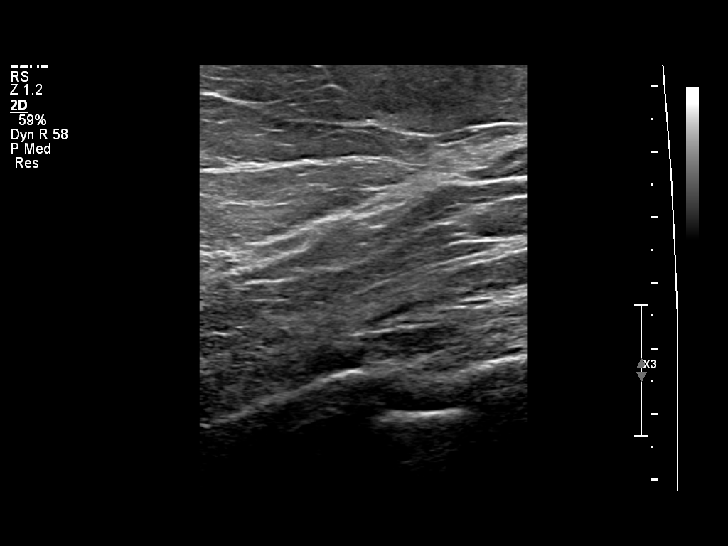
[im 14/15]
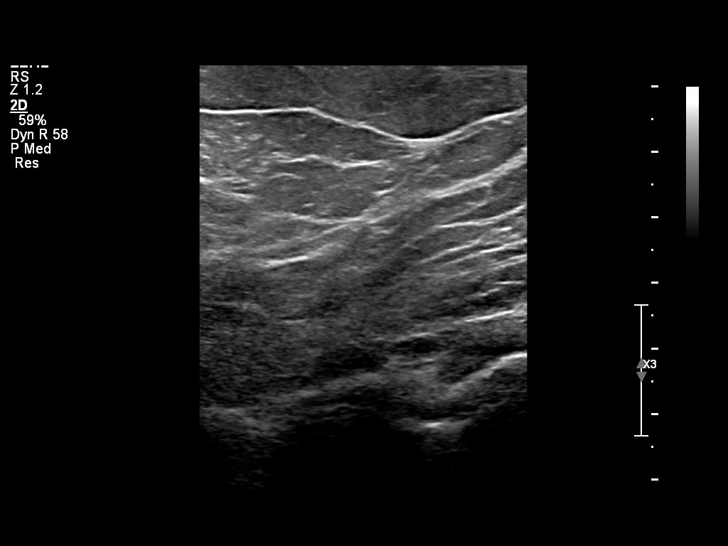
[im 15/15]
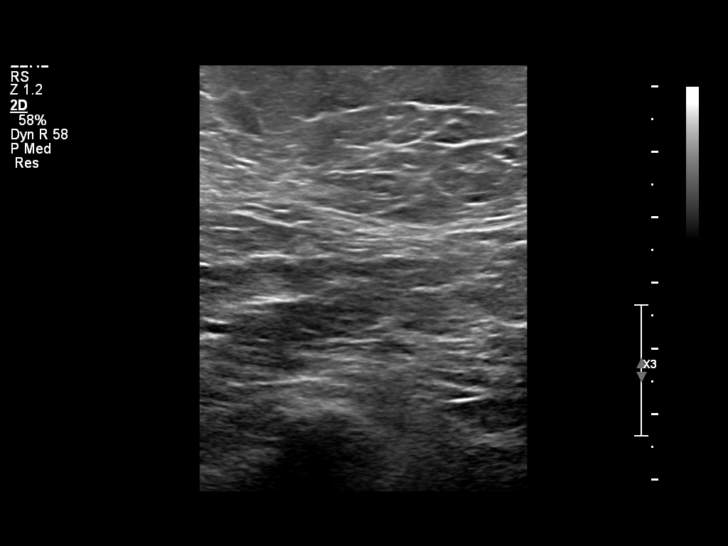

[14 of 15 positions shown; findings below may reference images not displayed]

FINDINGS: Stable septated cyst in the posterior left breast measuring up to
mm. No suspicious shadowing or drainable fluid collections. No
axillary adenopathy 
IMPRESSION
IMPRESSION: Stable left breast cyst. Recommend patient resume annual screening
mammogram.
BI-RADS 2: Benign findings.

## 2017-05-24 ENCOUNTER — Encounter: Admit: 2017-05-24 | Discharge: 2017-05-24 | Payer: MEDICARE

## 2017-05-24 DIAGNOSIS — K769 Liver disease, unspecified: ICD-10-CM

## 2017-05-24 DIAGNOSIS — M329 Systemic lupus erythematosus, unspecified: Principal | ICD-10-CM

## 2017-05-24 DIAGNOSIS — M797 Fibromyalgia: ICD-10-CM

## 2017-10-12 ENCOUNTER — Encounter: Admit: 2017-10-12 | Discharge: 2017-10-12 | Payer: MEDICARE

## 2017-10-12 ENCOUNTER — Ambulatory Visit: Admit: 2017-10-12 | Discharge: 2017-10-12 | Payer: MEDICARE

## 2017-10-12 DIAGNOSIS — R200 Anesthesia of skin: ICD-10-CM

## 2017-10-12 DIAGNOSIS — D8689 Sarcoidosis of other sites: ICD-10-CM

## 2017-10-12 DIAGNOSIS — K210 Gastro-esophageal reflux disease with esophagitis: Secondary | ICD-10-CM

## 2017-10-12 DIAGNOSIS — L309 Dermatitis, unspecified: ICD-10-CM

## 2017-10-12 DIAGNOSIS — R748 Abnormal levels of other serum enzymes: ICD-10-CM

## 2017-10-12 DIAGNOSIS — K7581 Nonalcoholic steatohepatitis (NASH): ICD-10-CM

## 2017-10-12 DIAGNOSIS — M797 Fibromyalgia: ICD-10-CM

## 2017-10-12 DIAGNOSIS — R202 Paresthesia of skin: ICD-10-CM

## 2017-10-12 DIAGNOSIS — K769 Liver disease, unspecified: ICD-10-CM

## 2017-10-12 DIAGNOSIS — M329 Systemic lupus erythematosus, unspecified: Principal | ICD-10-CM

## 2017-10-12 DIAGNOSIS — R5382 Chronic fatigue, unspecified: ICD-10-CM

## 2017-10-12 LAB — COMPREHENSIVE METABOLIC PANEL
Lab: 0.7 mg/dL (ref 0.4–1.00)
Lab: 140 MMOL/L (ref 137–147)
Lab: 3.7 MMOL/L (ref 3.5–5.1)
Lab: 98 mg/dL (ref 70–100)
Lab: >60 mL/min (ref >60)

## 2017-10-12 LAB — CBC
Lab: 13 % — ABNORMAL HIGH (ref 11–15)
Lab: 14 g/dL (ref 12.0–15.0)
Lab: 30 pg (ref 26–34)
Lab: 34 g/dL (ref 32.0–36.0)
Lab: 395 K/UL (ref 150–400)
Lab: 4.8 M/UL (ref 4.0–5.0)
Lab: 42 % — AB (ref 36–45)
Lab: 6.9 FL — ABNORMAL LOW (ref >60)
Lab: 8.1 K/UL (ref 4.5–11.0)
Lab: 89 FL — ABNORMAL HIGH (ref 80–100)

## 2017-10-12 LAB — THYROID STIMULATING HORMONE-TSH: Lab: 1 uU/mL (ref 0.35–5.00)

## 2017-10-12 LAB — RHEUMATOID FACTOR (RF): Lab: <10 [IU]/mL (ref <14)

## 2017-10-12 LAB — C REACTIVE PROTEIN (CRP): Lab: 0.5 mg/dL (ref <1.0)

## 2017-10-12 LAB — ANTI SSA ANTI SSB AB

## 2017-10-12 LAB — URINALYSIS DIPSTICK

## 2017-10-12 LAB — 25-OH VITAMIN D (D2 + D3): Lab: 28 ng/mL — ABNORMAL LOW (ref 30–80)

## 2017-10-12 LAB — C3 COMPLEMENT 3: Lab: 187 mg/dL (ref 88–200)

## 2017-10-12 LAB — SED RATE: Lab: 13 mm/h (ref 0–30)

## 2017-10-12 LAB — ANTI SMITH(SM) ANTI RNP AB

## 2017-10-12 LAB — URINALYSIS, MICROSCOPIC

## 2017-10-12 LAB — CREATINE KINASE-CPK: Lab: 66 U/L (ref 21–215)

## 2017-10-12 LAB — C4 COMPLEMENT 4: Lab: 48 mg/dL (ref 10–49)

## 2017-10-15 ENCOUNTER — Encounter: Admit: 2017-10-15 | Discharge: 2017-10-15 | Payer: MEDICARE

## 2017-10-15 DIAGNOSIS — D801 Nonfamilial hypogammaglobulinemia: Principal | ICD-10-CM

## 2017-10-15 LAB — ANTI-NUCLEAR ANTIBODY(ANA): Lab: <80 {titer} (ref <80)

## 2017-10-15 LAB — ELECTROPHORESIS-SERUM PROTEIN
Lab: 6.4 g/dL (ref 6.0–8.0)
Lab: 61 % (ref 48–68)

## 2017-10-15 LAB — ANTI-DNA DOUBLE STRAND: Lab: <10 {titer} (ref <10)

## 2017-10-19 ENCOUNTER — Encounter: Admit: 2017-10-19 | Discharge: 2017-10-19 | Payer: MEDICARE

## 2018-01-03 ENCOUNTER — Encounter: Admit: 2018-01-03 | Discharge: 2018-01-03 | Payer: MEDICARE

## 2018-01-14 ENCOUNTER — Encounter: Admit: 2018-01-14 | Discharge: 2018-01-14 | Payer: MEDICARE

## 2018-01-14 ENCOUNTER — Ambulatory Visit: Admit: 2018-01-14 | Discharge: 2018-01-15 | Payer: MEDICARE

## 2018-01-14 DIAGNOSIS — M329 Systemic lupus erythematosus, unspecified: Principal | ICD-10-CM

## 2018-01-14 DIAGNOSIS — M797 Fibromyalgia: ICD-10-CM

## 2018-01-14 DIAGNOSIS — K769 Liver disease, unspecified: ICD-10-CM

## 2018-01-14 IMAGING — US US VENOUS DOPPLER LEG BILATERAL
1 series · 14 of 24 positions shown · non-contrast
Comparison: 12/10/2015

BILATERAL LOWER EXTREMITY VENOUS DUPLEX ULTRASOUND 08/22/2016 [DATE]
HISTORY: 66 years Female presents with pulmonary embolism

[Series 1: us venous doppler leg bilateral · portal-venous · 14 of 44 slices shown]
[im 1/44]
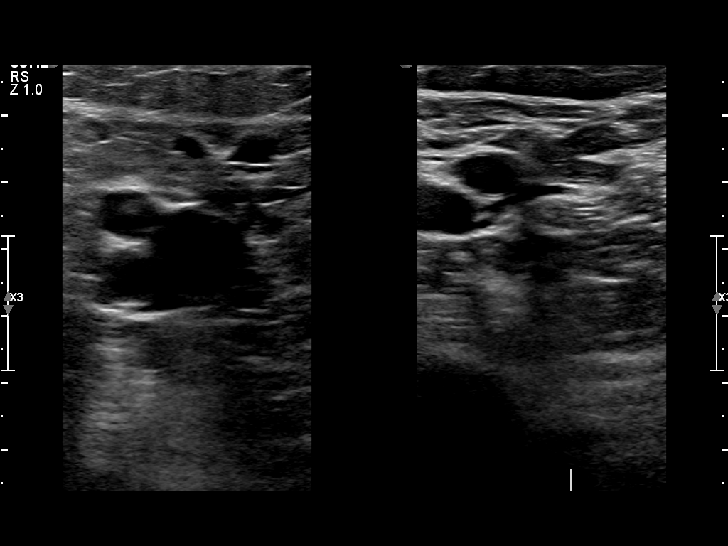
[im 4/44]
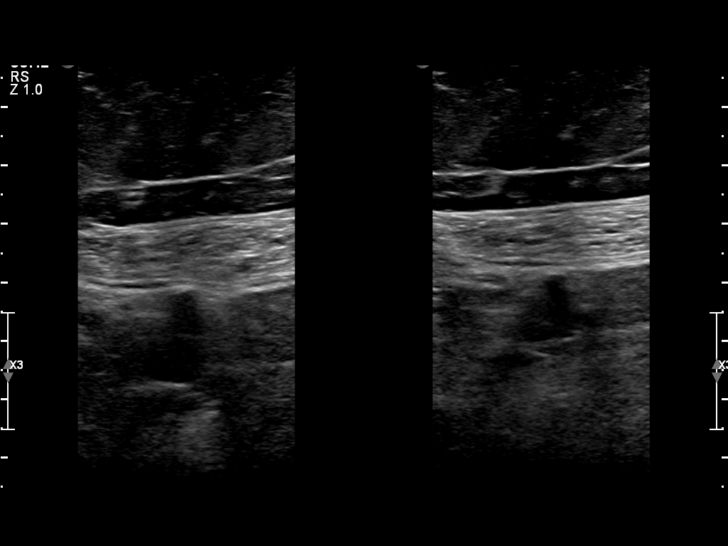
[im 8/44]
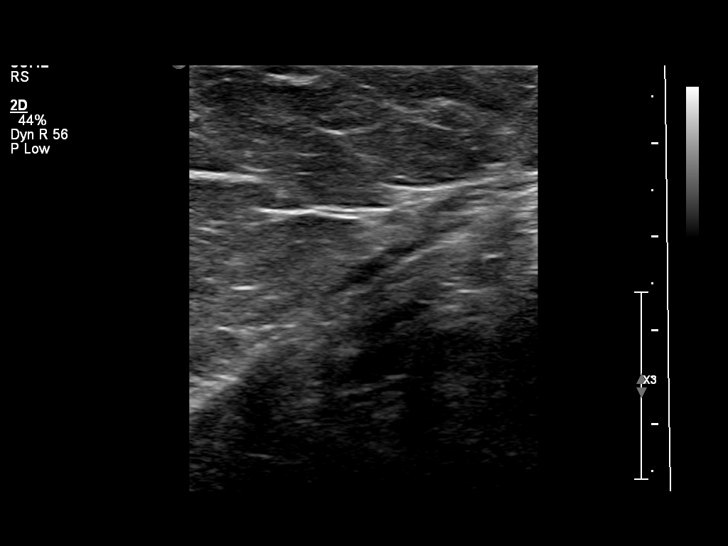
[im 12/44]
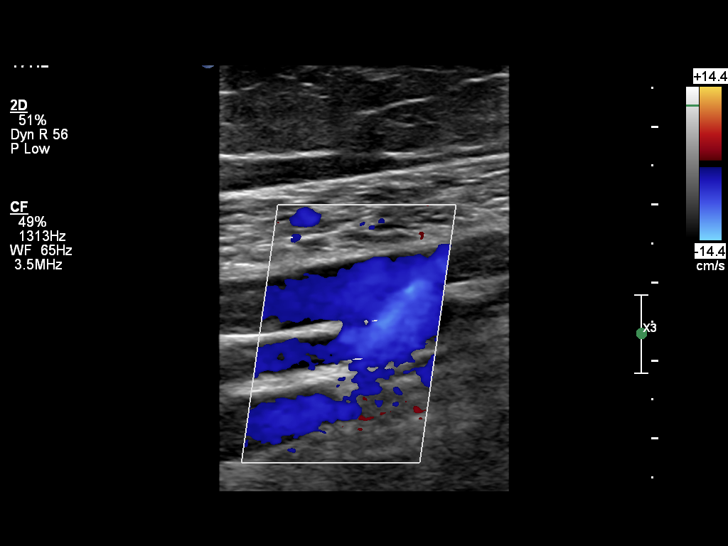
[im 14/44]
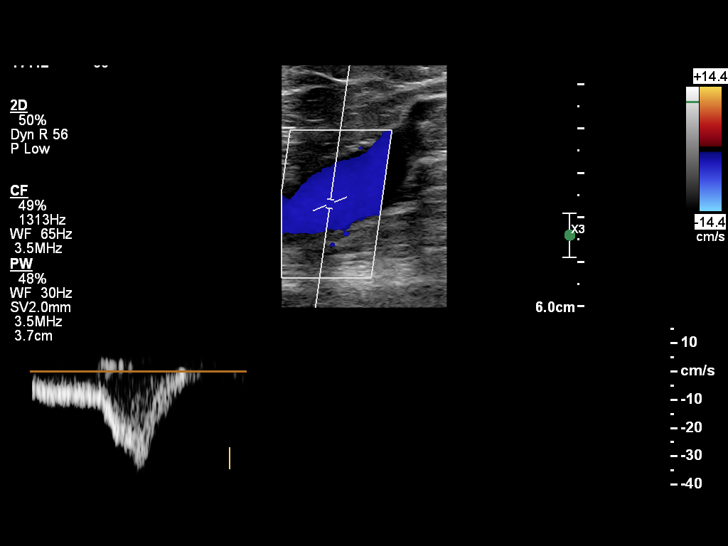
[im 17/44]
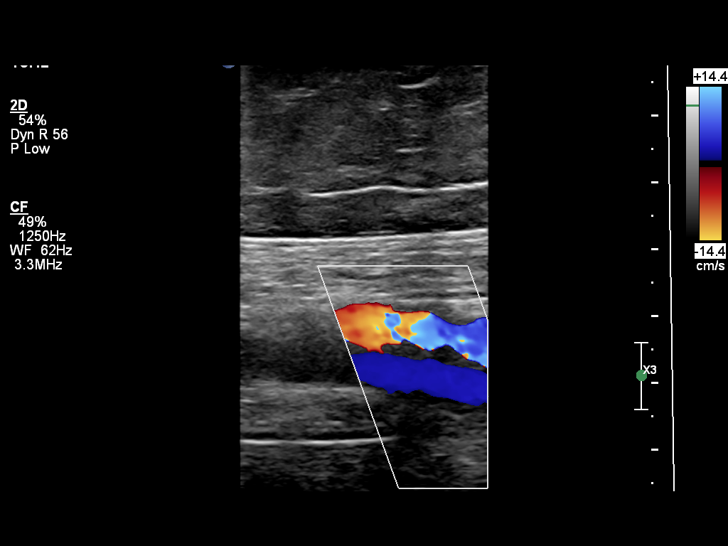
[im 21/44]
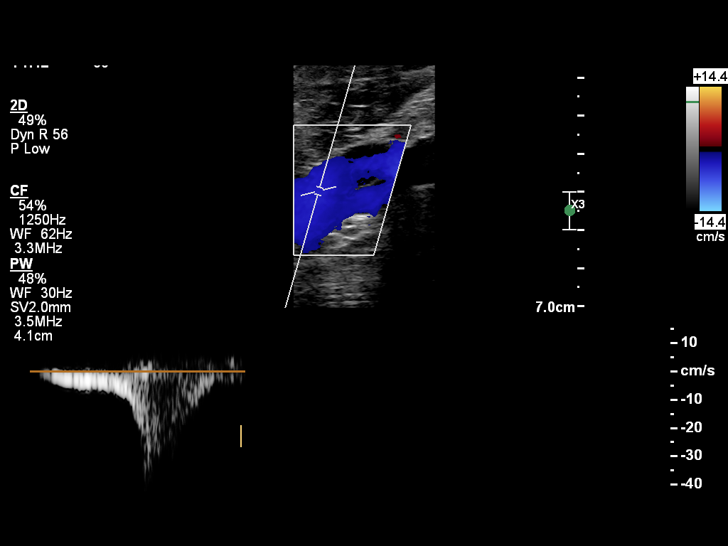
[im 23/44]
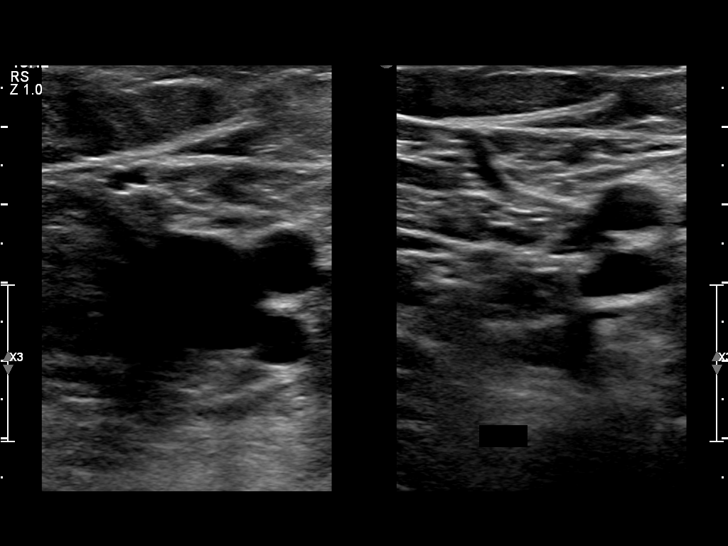
[im 27/44]
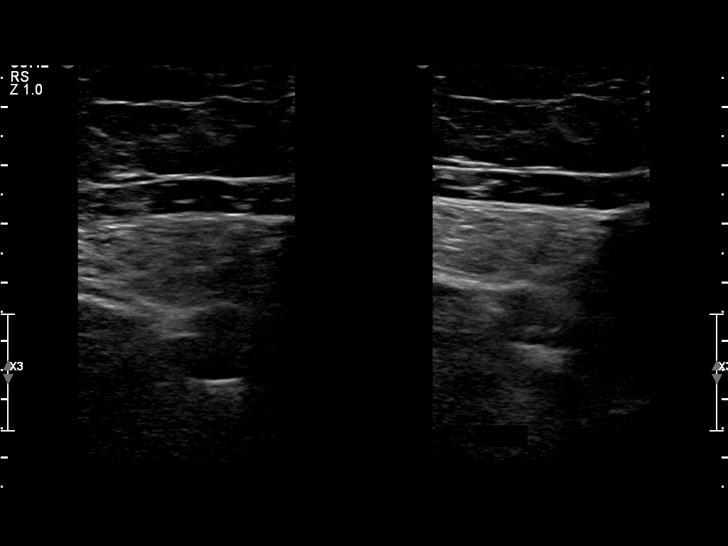
[im 30/44]
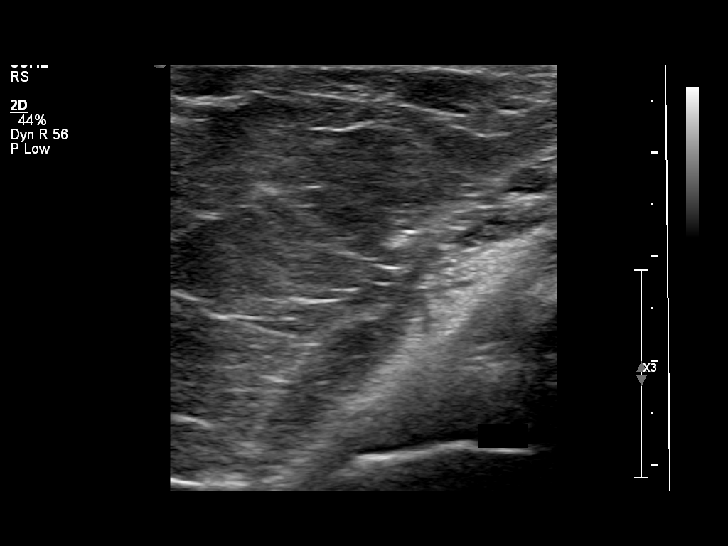
[im 34/44]
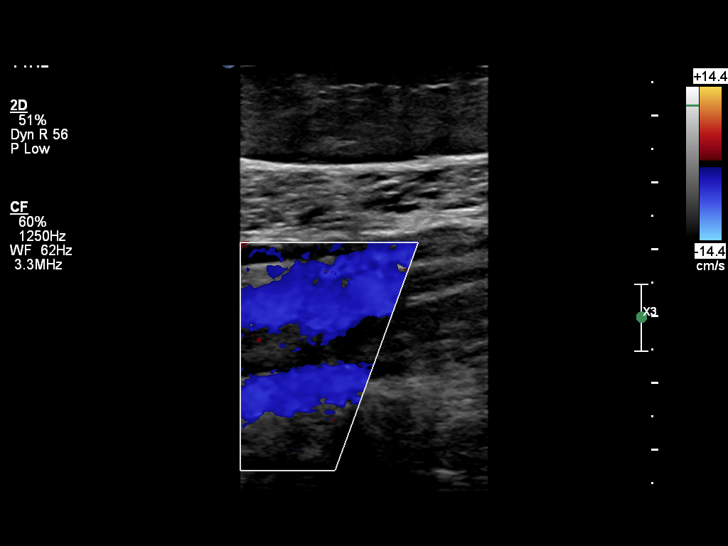
[im 36/44]
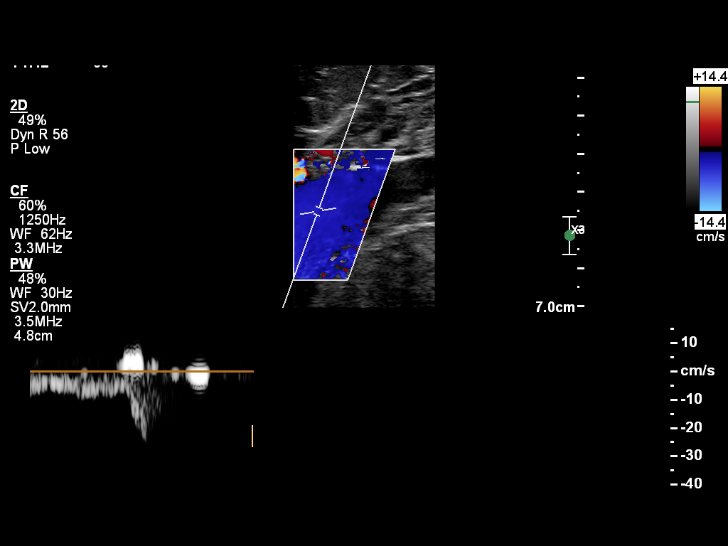
[im 40/44]
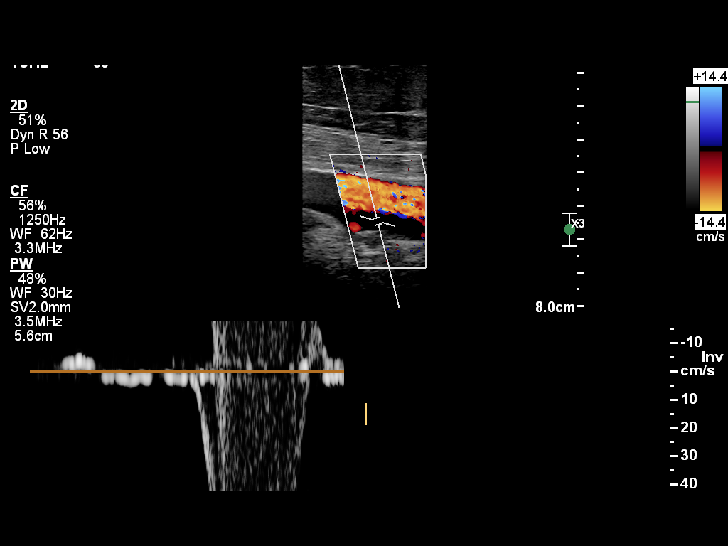
[im 44/44]
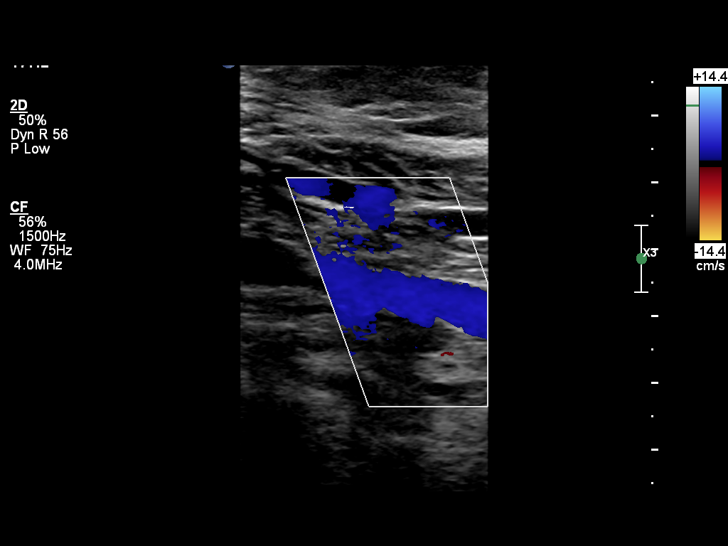

[14 of 24 positions shown; findings below may reference images not displayed]

FINDINGS: RIGHT LOWER EXTREMITY:
The right lower extremity veins are patent and free of thrombus. There
is normal compressibility, phasicity and augmentation.
LEFT LOWER EXTREMITY: 
The left lower extremity veins are patent and free of thrombus. There 
is normal compressibility, phasicity and augmentation.
IMPRESSION
IMPRESSION: No deep vein thrombosis.

## 2018-01-15 DIAGNOSIS — M5030 Other cervical disc degeneration, unspecified cervical region: ICD-10-CM

## 2018-01-15 DIAGNOSIS — M5412 Radiculopathy, cervical region: Principal | ICD-10-CM

## 2018-01-22 IMAGING — US ECHO COMPLETE
1 series · 13 of 24 positions shown · non-contrast
Comparison: none

ECHOCARDIOGRAM REPORT:
The echocardiogram was performed conforming to the [employer] Society of  
Echocardiography protocols for a complete study using 2-D/M-mode, spectral  
and color flow Doppler modalities in obtaining imaging, hemodynamics, 
measurements, and calculations.
INDICATION: Acute pulmonary embolism. 
Baseline rhythm is sinus rhythm.
DEMOGRAPHIC FINDINGS: 
Height:  5'4" 
Weight:  215 pounds and 0 ounces
BSA:  2.02 m2 
QUANTITATIVE FINDINGS:
LVOT diameter 19.9 mm 
LVOT area 31.1 m2 
M-mode: 
Septal wall thickness in end diastole 15.2 mm 
LV posterior wall thickness in end diastole 14.7 mm 
LV internal dimension in end diastole is 56.9 mm
LEFT VENTRICLE STRUCTURE: 
Normal LV size. 
LEFT VENTRICLE FUNCTION:
Normal left ventricular systolic function.
The estimated left ventricular ejection fraction is 60-65 percent.
There is Grade 1 diastolic dysfunction present consistent with impaired 
relaxation and normal filling pressures.
LEFT VENTRICULAR WALL MOTION: 
Normal global left ventricular wall motion present. 
No regional wall motion abnormalities are noted.
RIGHT VENTRICLE STRUCTURE:
Normal right ventricular size.
RIGHT VENTRICLE FUNCTION: 
Normal right ventricular systolic function. 
LEFT ATRIUM:
Normal left atrial size.
RIGHT ATRIUM: 
Normal right atrial size. 
MITRAL VALVE: 
There is mild posterior annular calcification present.
There is no mitral valve regurgitation present. 
AORTIC VALVE: 
The aortic valve was poorly visualized but appears to be tri-leaflet. 
There is trace aortic valve regurgitation present.
TRICUSPID VALVE:
The tricuspid valve is fairly visualized. 
There is trace tricuspid valve regurgitation present. 
Unable to estimate right ventricular systolic pressure due to poor TR jet 
velocity. 
PULMONIC VALVE: 
The pulmonic valve was poorly visualized. 
There is no pulmonic valve regurgitation present. 
AORTA:
The aortic root is normal in size.
PERICARDIUM:
There is no pericardial effusion present. 
INFERIOR VENA CAVA: 
The inferior vena cava is normal in size and demonstrates more than 50% 
collapse with respiration.
INTRACARDIAC MASS/THROMBUS: 
No obvious intracardiac mass or thrombus is visualized. 
INTRAATRIAL SEPTUM: 
No obvious PFO/ASD is visualized by 2D imaging or color flow Doppler.

[Series 1: echo complete · 61 acquisitions, 13 frames shown]
[im 1/61]
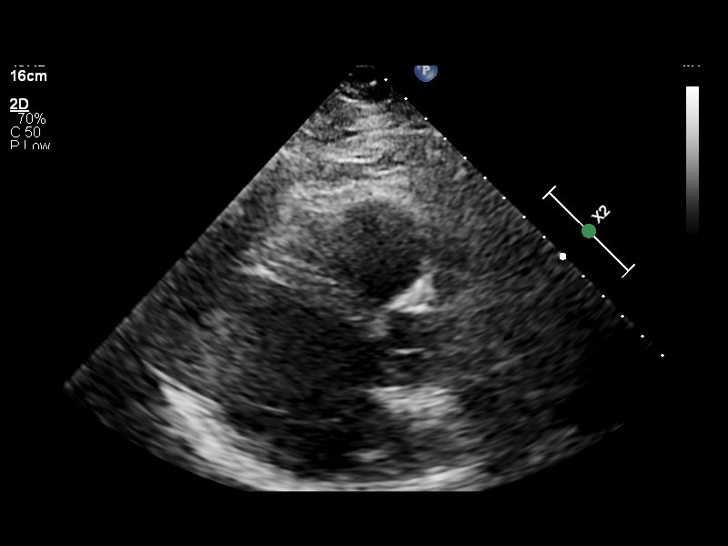
[im 3/61]
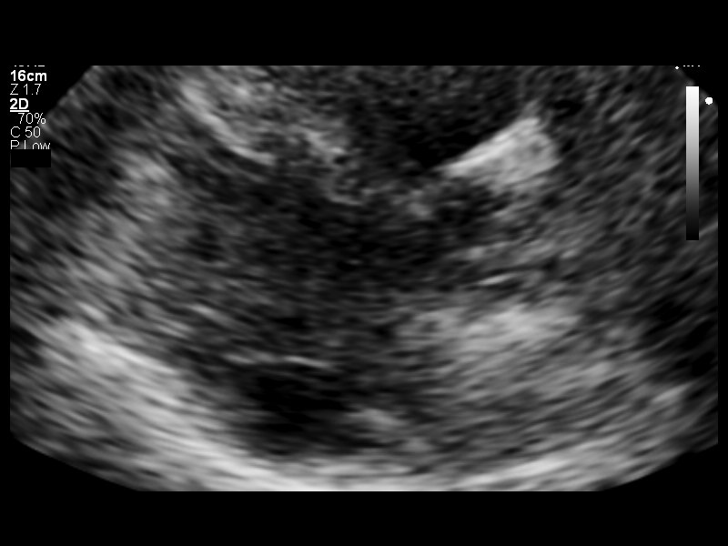
[im 11/61]
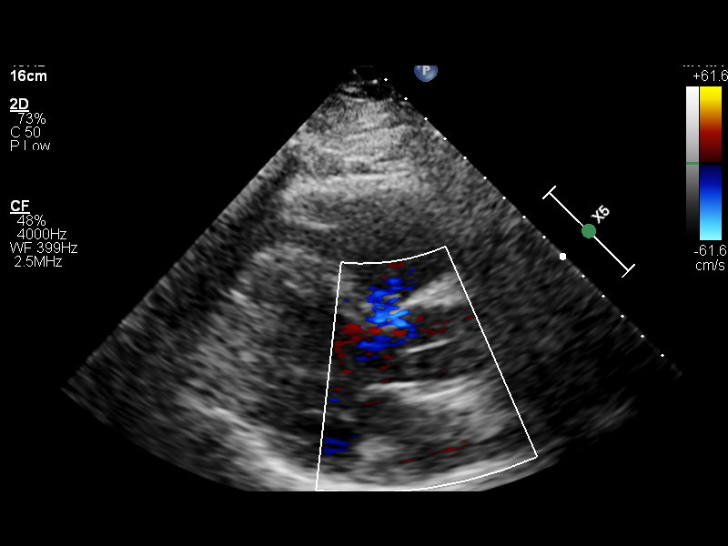
[im 16/61]
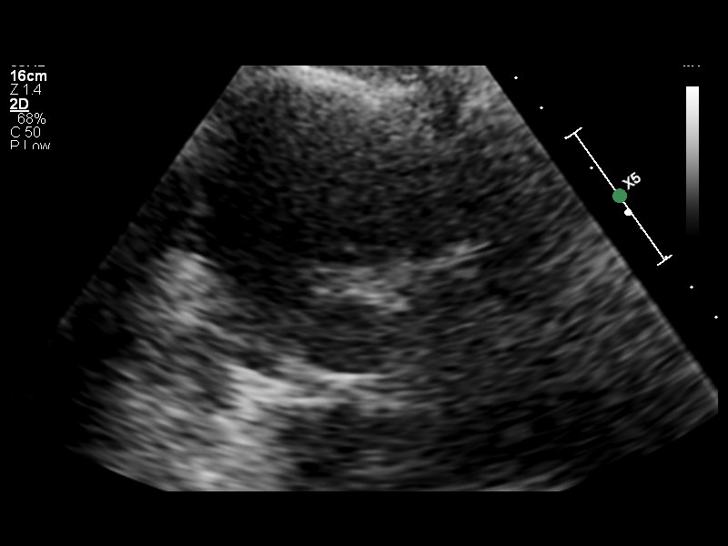
[im 21/61]
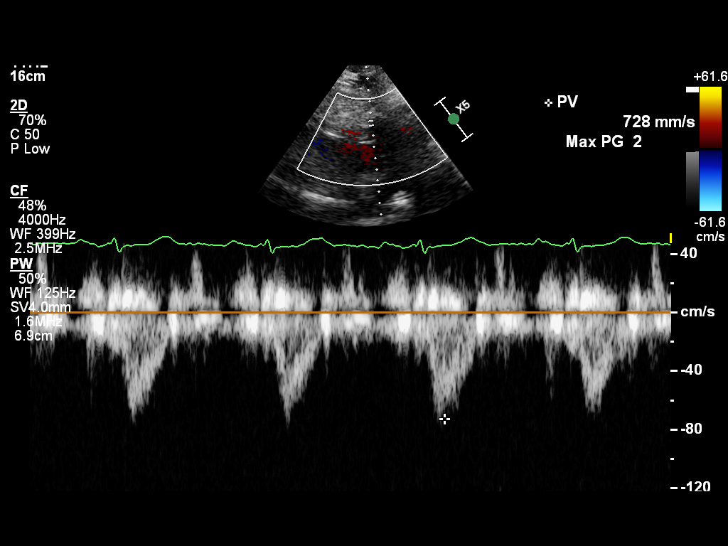
[im 27/61]
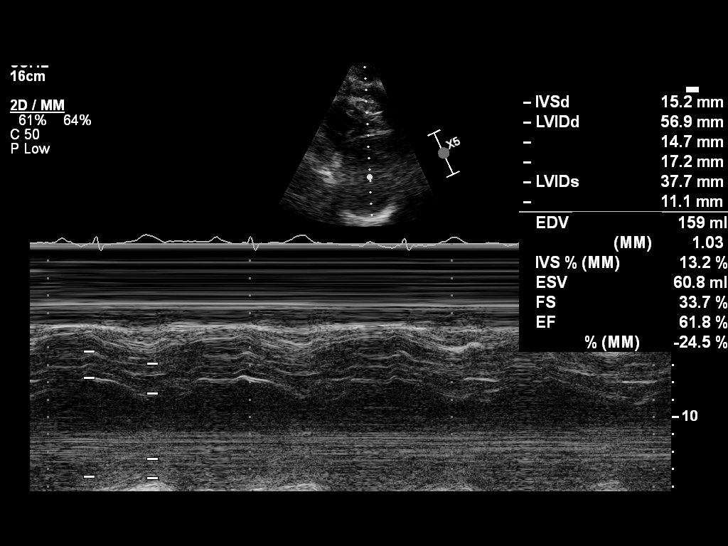
[im 32/61]
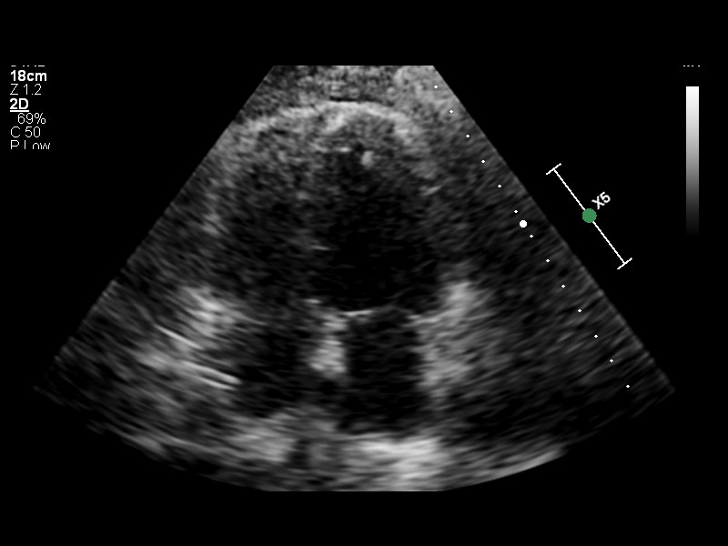
[im 37/61]
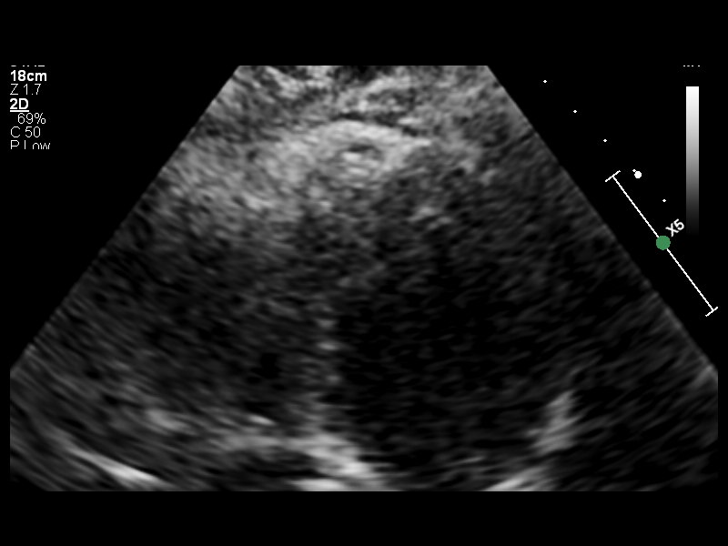
[im 40/61]
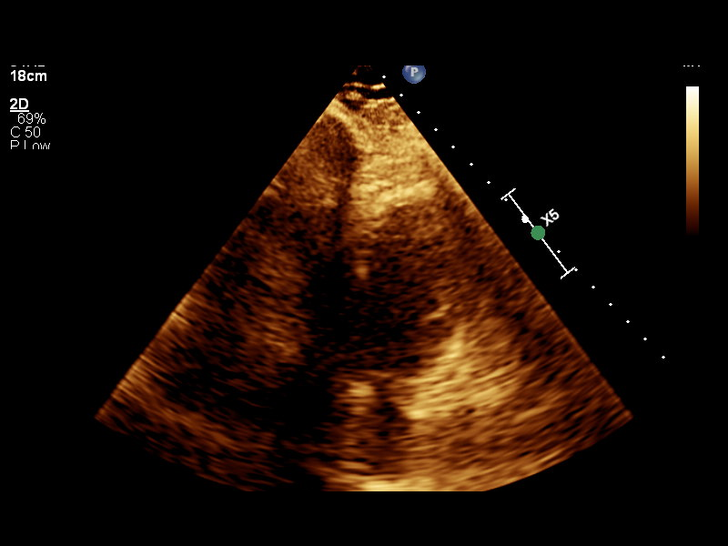
[im 45/61]
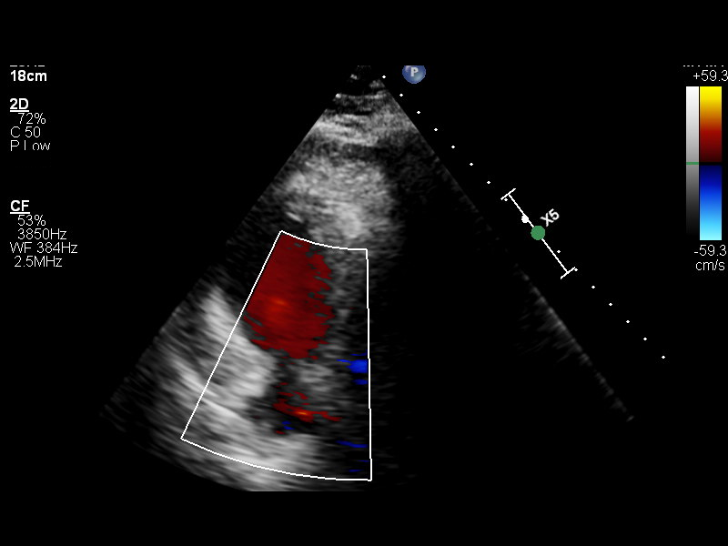
[im 50/61]
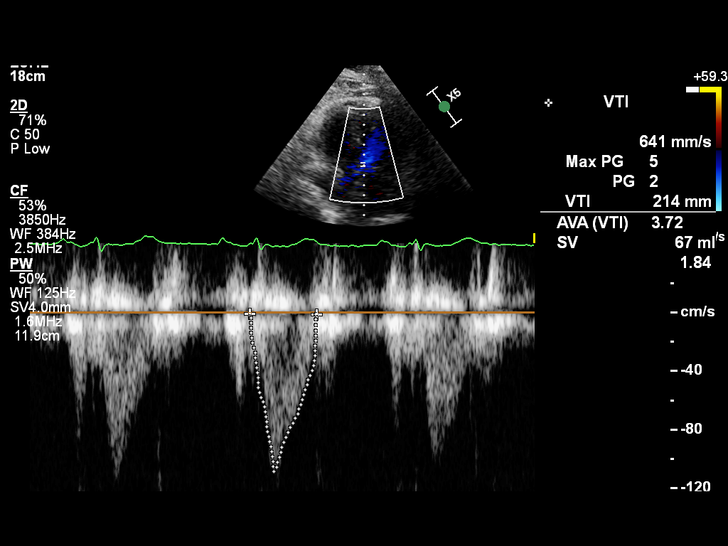
[im 58/61]
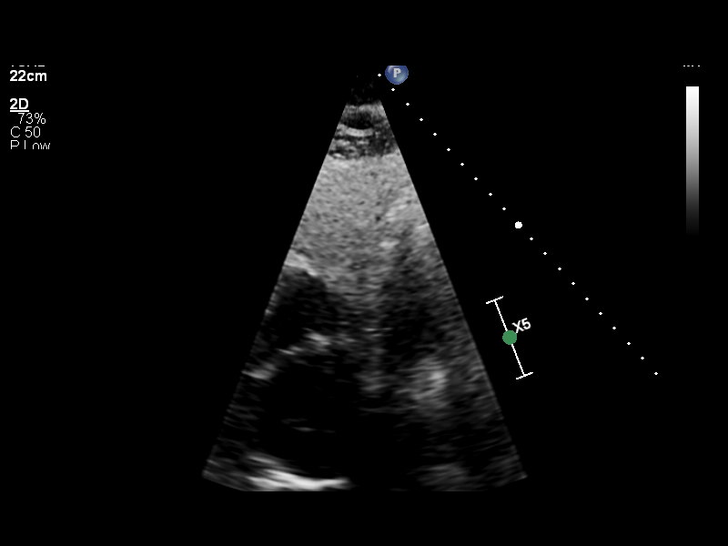
[im 61/61]
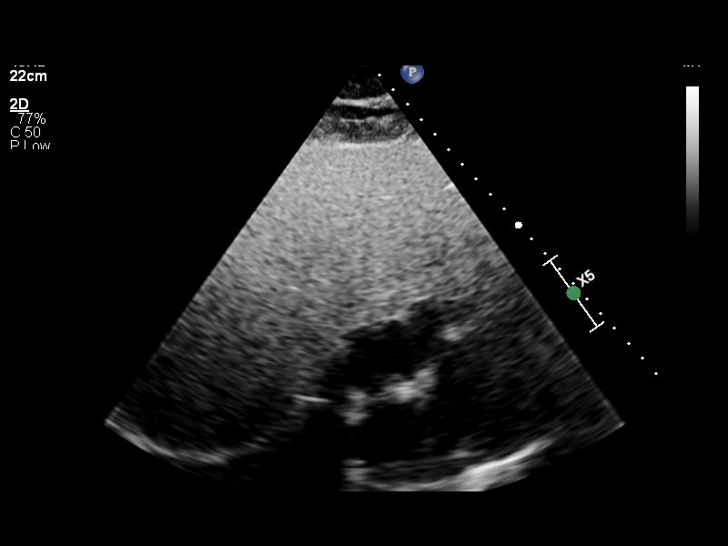

[13 of 24 positions shown; findings below may reference images not displayed]

CONCLUSION: Normal left ventricular size and function.
The estimated left ventricular ejection fraction is 60-65%. 
There is Grade 1 diastolic dysfunction present consistent with impaired 
relaxation and normal filling pressures.
There is trace aortic valve regurgitation present.
There is mild concentric left ventricular hypertrophy present.

## 2018-03-18 ENCOUNTER — Encounter: Admit: 2018-03-18 | Discharge: 2018-03-18 | Payer: MEDICARE

## 2018-03-18 DIAGNOSIS — M329 Systemic lupus erythematosus, unspecified: Secondary | ICD-10-CM

## 2018-03-18 DIAGNOSIS — M797 Fibromyalgia: Secondary | ICD-10-CM

## 2018-03-18 DIAGNOSIS — K769 Liver disease, unspecified: Secondary | ICD-10-CM

## 2018-03-18 MED ORDER — TRIAMCINOLONE ACETONIDE 40 MG/ML IJ SUSP
40 mg | Freq: Once | INTRAMUSCULAR | 0 refills | Status: CP | PRN
Start: 2018-03-18 — End: ?
  Administered 2018-03-18: 19:00:00 40 mg via INTRAMUSCULAR

## 2018-03-19 ENCOUNTER — Ambulatory Visit: Admit: 2018-03-18 | Discharge: 2018-03-19 | Payer: MEDICARE

## 2018-03-19 DIAGNOSIS — M5030 Other cervical disc degeneration, unspecified cervical region: Secondary | ICD-10-CM

## 2018-03-19 DIAGNOSIS — M5412 Radiculopathy, cervical region: Secondary | ICD-10-CM

## 2018-04-18 ENCOUNTER — Encounter: Admit: 2018-04-18 | Discharge: 2018-04-18 | Payer: MEDICARE

## 2018-05-07 ENCOUNTER — Encounter: Admit: 2018-05-07 | Discharge: 2018-05-07 | Payer: MEDICARE

## 2018-05-07 DIAGNOSIS — M5412 Radiculopathy, cervical region: Principal | ICD-10-CM

## 2018-05-07 DIAGNOSIS — M5030 Other cervical disc degeneration, unspecified cervical region: ICD-10-CM

## 2018-05-07 DIAGNOSIS — M791 Myalgia, unspecified site: ICD-10-CM

## 2018-05-07 DIAGNOSIS — M7918 Myalgia, other site: ICD-10-CM

## 2018-05-23 ENCOUNTER — Encounter: Admit: 2018-05-23 | Discharge: 2018-05-23 | Payer: MEDICARE

## 2018-06-12 ENCOUNTER — Encounter: Admit: 2018-06-12 | Discharge: 2018-06-12 | Payer: MEDICARE

## 2018-06-12 DIAGNOSIS — K7581 Nonalcoholic steatohepatitis (NASH): ICD-10-CM

## 2018-06-12 DIAGNOSIS — K760 Fatty (change of) liver, not elsewhere classified: Principal | ICD-10-CM

## 2018-06-21 ENCOUNTER — Encounter: Admit: 2018-06-21 | Discharge: 2018-06-21 | Payer: MEDICARE

## 2018-07-09 NOTE — Telephone Encounter
Spoke to pt to reschedule cancelled appt. Scheduled CFT lab draw visit at 1040 on 07/12/18 and clinic visit w/[facility] on 07/12/18 at 1100. Reviewed no visitors and temperature policy. Pt VU.

## 2018-07-12 ENCOUNTER — Encounter: Admit: 2018-07-12 | Discharge: 2018-07-12 | Payer: MEDICARE

## 2018-07-12 NOTE — Telephone Encounter
Call from [facility] canceling today's appointment with Dr. Shea Evans.  She has a fever.  Please call to reschedule.

## 2018-07-24 ENCOUNTER — Encounter: Admit: 2018-07-24 | Discharge: 2018-07-24

## 2018-07-24 NOTE — Telephone Encounter
Spoke with pt to confirm appt 07/26/2018 at 11:30am with Marcelyn Bruins, APRN. Pt v/u.  Georgina Quint, LPN

## 2018-07-26 ENCOUNTER — Ambulatory Visit: Admit: 2018-07-26 | Discharge: 2018-07-26

## 2018-07-26 ENCOUNTER — Encounter: Admit: 2018-07-26 | Discharge: 2018-07-26

## 2018-07-26 DIAGNOSIS — K760 Fatty (change of) liver, not elsewhere classified: Secondary | ICD-10-CM

## 2018-07-26 DIAGNOSIS — E559 Vitamin D deficiency, unspecified: Secondary | ICD-10-CM

## 2018-07-26 DIAGNOSIS — M329 Systemic lupus erythematosus, unspecified: Secondary | ICD-10-CM

## 2018-07-26 DIAGNOSIS — K7581 Nonalcoholic steatohepatitis (NASH): Secondary | ICD-10-CM

## 2018-07-26 DIAGNOSIS — K769 Liver disease, unspecified: Secondary | ICD-10-CM

## 2018-07-26 DIAGNOSIS — M79671 Pain in right foot: Secondary | ICD-10-CM

## 2018-07-26 DIAGNOSIS — M797 Fibromyalgia: Secondary | ICD-10-CM

## 2018-07-26 LAB — CBC AND DIFF
Lab: 0 10*3/uL (ref 0–0.20)
Lab: 0.5 10*3/uL — ABNORMAL HIGH (ref 0–0.45)
Lab: 0.6 10*3/uL (ref 0–0.80)
Lab: 1 % (ref >60)
Lab: 13 % (ref 11–15)
Lab: 15 g/dL — ABNORMAL HIGH (ref 12.0–15.0)
Lab: 2.4 10*3/uL (ref 1.0–4.8)
Lab: 31 % (ref 24–44)
Lab: 32 pg (ref 26–34)
Lab: 34 g/dL (ref 32.0–36.0)
Lab: 361 K/UL — ABNORMAL HIGH (ref 150–400)
Lab: 4.4 10*3/uL (ref 1.8–7.0)
Lab: 4.6 M/UL (ref 4.0–5.0)
Lab: 43 % (ref 36–45)
Lab: 53 % (ref 41–77)
Lab: 7 % — ABNORMAL HIGH (ref >60)
Lab: 7.4 FL (ref 7–11)
Lab: 8 % (ref 4–12)
Lab: 8.1 10*3/uL (ref 4.5–11.0)
Lab: 93 FL (ref 80–100)

## 2018-07-26 LAB — COMPREHENSIVE METABOLIC PANEL
Lab: 141 MMOL/L (ref 137–147)
Lab: 3.9 MMOL/L (ref 3.5–5.1)

## 2018-07-26 LAB — PROTIME INR (PT): Lab: 2.3 mL/min — ABNORMAL HIGH (ref >60)

## 2018-07-26 LAB — 25-OH VITAMIN D (D2 + D3): Lab: 68 ng/mL — ABNORMAL HIGH (ref 30–80)

## 2018-07-26 NOTE — Progress Notes
Date of Service: 07/26/2018    Chief complaint/Reason for visit:  Routine hepatology viist  Native Liver Disease: NASH, Stage II Fibrosis (biopsy proven 2018)  Last clinic visit: 12/22/2016 with Dr. Shea Evans  PCP: Dr. Hulan Fess  Rheumatology: Dr. Berdie Ogren  Anesthesia: Dr. Samara Deist    Subjective:         Holly Dixon is a 68 y.o. adult with a PMH of skin dermatitis, arthralgia and myalgia in the setting of history of fibromyalgia and an outside diagnosis of possible lupus, but not confirmed. 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        History of Present Illness    Holly Dixon is a very pleasant 68 year old presenting to the hepatology clinic for routine follow-up for management of her biopsy-proven Holly Dixon with stage II fibrosis.  She presents to clinic alone today in good functional status and is not using any assistive devices.  She reports since her last visit with Dr. Shea Evans in 2018 she has been doing relatively well.    We briefly reviewed that she has had a extensive liver work-up as she has a previous history of a liver biopsy that showed evidence of sarcoidosis.  And her most recent biopsy from 2018 there was no noted evidence.  She does continue to follow with rheumatology, Dr.Maz, for other forms of dermatitis.  She does additionally does not have evidence of SLE or connective tissue disease.    We reviewed her most recent biopsy with a diagnosis of NASH.  We discussed several treatment pathways in relation to her disease process.  We reviewed the role of metabolic risk factors and their impact on patients with NASH.  Discussed that uncontrolled hypertension, hyperlipidemia, diabetes, and untreated obstructive sleep apnea are all components that can negatively impact her.  Reviewed her blood pressure at today's appointment was controlled at 138/75.  We additionally discussed the implementation of lifestyle modifications including Mediterranean style diet that is moderately low in carbohydrates and consists of lean proteins (chicken, fish, [place]).  We reviewed the recommendation to reduce the amounts of red meat consumption.  We discussed the recommendation of black coffee, however, patient does not drink coffee.  We reviewed her most recent labs drawn prior to today's visit.  Encouraged patient to continue her dietary modifications and discussed she does not have impaired glucose tolerance and therefore do not recommend the addition of a GLP-1 agonist at this time.  We briefly discussed that patient was previously considering a gastric sleeve surgery at her prior visit.  She reports that at this time this is not a treatment path she wishes to pursue.    We briefly reviewed the role of MELD scoring and its role in liver transplantation. Reviewed the Model for End Stage Liver Disease or MELD score is the score of the severity of your liver disease which ranges from least severe at 6 to most severe at 40. Discussed his most updated MELD score from today was 16.  We reviewed that with her continued use of Coumadin to achieve an INR goal of between 2-3 that her MELD score is falsely elevated.   She continues to follow with  Dr. Hulan Fess for management of her anticoagulation status post pulmonary embolism.  She reports that at this time she does not have an anticipated stop date for her anticoagulation.    We additionally discussed that her liver biopsy showed she had stage II fibrosis.  Reviewed the spectrum of fibrosis ranging from F0/1-F4.  We discussed that she does not need any additional surveillance with her current fibrosis.  Plan to repeat re-stratification next year with an updated FibroScan.  She reports that she does use daily diuretic therapy: Lasix 20 mg.  She additionally endorses that she will occasionally use a PRN dosage of 20 mg every 3 or 4 days if she gets additional lower extremity swelling.  She denies any lower extremity pain or unequal swelling.  She denies any abdominal ascites.  She denies any signs or symptoms of hepatic encephalopathy presents.  She reports she has never undergone an upper endoscopy.  She denies any history of upper GI bleeding.  She reports she has had a colonoscopy in the past and was asked to repeat it in 5 years time.  She reports she will be due in the near future.    She states her right foot occasionally has swelling and has previous nerve damage.  She reports she would like to follow-up with a foot specialist here at [facility].  Will place referral for patient to follow-up with Dr. Azucena Cecil.    She denies any headache, fatigue, fevers, chest pain, or shortness of breath. Denies any confusion or syncope.  Denies any right upper quadrant pain, tremors, yellowing of the skin or eyes, pruritis, abdominal distention or lower extremity edema. Denies any nausea, hematemesis, diarrhea, constipation, melena, or hematochezia. She  denies any active tobacco or nicotine use, alcohol consumption, or illicit substance use. Denies any hospitalizations, emergency room visits, or the necessity to present to urgent care since her last visit.     Medical History:   Diagnosis Date   ??? Fibromyalgia    ??? Liver problem    ??? Lupus [facility])        Surgical History:   Procedure Laterality Date   ??? ESOPHAGOGASTRODUODENOSCOPY N/A 10/06/2015    Performed by Michelene Heady, MBBS at [facility] ENDO   ??? ESOPHAGOGASTRODUODENOSCOPY BIOPSY N/A 10/06/2015    Performed by Michelene Heady, MBBS at [facility] ENDO   ??? EGD with biopsies for f/u on EOE N/A 03/09/2016    Performed by Eliott Nine, MD at [facility] ENDO   ??? ESOPHAGOGASTRODUODENOSCOPY BIOPSY  03/09/2016    Performed by Eliott Nine, MD at [facility] ENDO   ??? APPENDECTOMY     ??? BREAST REDUCTION Bilateral    ??? ERCP     ??? FOOT SURGERY     ??? HX CHOLECYSTECTOMY     ??? KNEE SURGERY     ??? LIVER BIOPSY     ??? NECK SURGERY Bilateral    ??? SHOULDER SURGERY Family History   Problem Relation Age of Onset   ??? Diabetes Mother    ??? Arthritis Mother    ??? COPD Mother    ??? Heart Attack Father    ??? Melanoma Neg Hx        Social History     Socioeconomic History   ??? Marital status: Married     Spouse name: Not on file   ??? Number of children: Not on file   ??? Years of education: Not on file   ??? Highest education level: Not on file   Occupational History   ??? Not on file   Tobacco Use   ??? Smoking status: Never Smoker   ??? Smokeless tobacco: Never Used   Substance and Sexual Activity   ??? Alcohol use: No     Alcohol/week: 0.0 standard drinks   ???  Drug use: No   ??? Sexual activity: Yes     Partners: Male   Other Topics Concern   ??? Not on file   Social History Narrative   ??? Not on file       Review of Systems   Constitutional: Negative.    HENT: Negative.    Eyes: Negative.    Respiratory: Negative.  Negative for shortness of breath.    Cardiovascular: Negative.  Negative for chest pain and leg swelling.   Gastrointestinal: Negative.  Negative for abdominal distention, abdominal pain, blood in stool, constipation, diarrhea, nausea and vomiting.   Endocrine: Negative.    Genitourinary: Negative.    Musculoskeletal: Negative.    Skin: Positive for color change (erythema).   Allergic/Immunologic: Negative.    Neurological: Negative.  Negative for tremors, syncope, light-headedness and headaches.   Hematological: Negative.    Psychiatric/Behavioral: Negative.          Objective:         ??? CALCIUM CARBONATE (CALCIUM 600 PO) Take 1,200 mg by mouth daily.   ??? chlorzoxazone(+) (PARAFON FORTE) 500 mg tablet Take 500 mg by mouth four times daily as needed for Muscle Cramps.   ??? cholecalciferol (VITAMIN D-3) 1,000 units tablet Take 1,000 Units by mouth daily.   ??? desipramine(+) (NORPRAMIN) 25 mg tablet Take 25 mg by mouth twice daily.   ??? esomeprazole DR(+) (NEXIUM) 40 mg capsule TAKE 1 CAPSULE BY MOUTH TWICE DAILY BEFORE MEALS. TAKE ON AN EMPTY STOMACH AT LEAST 1 HOUR BEFORE OR 2 HOURS AFTER FOOD ??? furosemide (LASIX) 20 mg tablet Take 20 mg by mouth as Needed.   ??? HYDROcodone/acetaminophen(+) (NORCO) 10/325 mg tablet Take 1 Tab by mouth every 6 hours as needed for Pain   ??? methadone (DOLOPHINE; METHADOSE) 10 mg tablet Take 10 mg by mouth three times daily   ??? potassium chloride SR (K-DUR) 20 mEq tablet Take 20 mEq by mouth daily. Take with a meal and a full glass of water.   ??? promethazine (PHENERGAN) 25 mg tablet Take 25 mg by mouth every 6 hours as needed for Nausea or Vomiting.   ??? warfarin (COUMADIN) 5 mg tablet Take 5 mg by mouth four times weekly. 2 1/2 three days a week     Vitals:    07/26/18 1102   BP: 138/75   BP Source: Arm, Left Upper   Patient Position: Sitting   Pulse: 81   Resp: 16   Temp: 36.9 ???C (98.4 ???F)   TempSrc: Oral   SpO2: 97%   Weight: 96.9 kg (213 lb 9.6 oz)   Height: 162.6 cm (64)   PainSc: Eight     Body mass index is 36.66 kg/m???.     Labs and Diagnostic Test:  MELD:  MELD Score 07/26/2018 11/14/2016 05/16/2016 10/06/2015 06/15/2015   MELD Score 16 16 6 6 6        Basic Labs:  Basic Labs Latest Ref Rng & Units 07/26/2018 10/12/2017   AFP - - -   NA 137 - 147 MMOL/L 141 140   K 3.5 - 5.1 MMOL/L 3.9 3.7   CL 98 - 110 MMOL/L 104 106   BUN 7 - 25 MG/DL 10 7   CR 0.4 - 1.61 MG/DL 0.96 0.45   GLUX 70 - 409 MG/DL 88 98   CA 8.5 - 81.1 MG/DL 8.9 9.2   TP 6.0 - 8.0 G/DL 6.8 6.9   TBILI 0.3 - 1.2 MG/DL 0.4 0.5   ALB  3.5 - 5.0 G/DL 4.2 4.1   ALKP 25 - 161 U/L 140(H) 176(H)   AST 7 - 40 U/L 32 39   ALT 7 - 56 U/L 40 66(H)   GFR >60 mL/min >60 >60   GFRAA >60 mL/min >60 >60   GAP 3 - 12 10 8    LIP - - -   CK 21 - 215 U/L - 66   VITD 30 - 80 NG/ML 68.7 28.1(L)   TSH 0.35 - 5.00 MCU/ML - 1.06   WBC 4.5 - 11.0 K/UL 8.1 8.1   RBC 4.0 - 5.0 M/UL 4.69 4.80   HGB 12.0 - 15.0 GM/DL 15.1(H) 14.7   MCV 80 - 100 FL 93.2 89.2   MCH 26 - 34 PG 32.2 30.7   MCHC 32.0 - 36.0 G/DL 09.6 04.5   PLT 409 - 811 K/UL 361 395   MPV 7 - 11 FL 7.4 6.9(L)   RDW 11 - 15 % 13.3 13.8   NEUT 41 - 77 % 53 - ANC 1.8 - 7.0 K/UL 4.40 -   LYMA 24 - 44 % 31 -   ALYM 1.0 - 4.8 K/UL 2.49 -   MONA 4 - 12 % 8 -   AMONO 0 - 0.80 K/UL 0.61 -   EOS 0 - 6 % - -   AEOS 0 - 0.45 K/UL 0.53(H) -   BASA 0 - 2 % 1 -   ABAS 0 - 0.20 K/UL 0.05 -   INR 0.8 - 1.2 2.3(H) -       Metabolic Labs:  Metabolic Labs Latest Ref Rng & Units 07/26/2018 10/12/2017   Alk Phos 25 - 110 U/L 140(H) 176(H)   Serum Folate >3.9 NG/ML - -   TSH 0.35 - 5.00 MCU/ML - 1.06   Vitamin D 30 - 80 NG/ML 68.7 28.1(L)       Autoimmune Labs:  Autoimmune Labs Latest Ref Rng & Units 10/12/2017 06/15/2015   AMA <20 TITER - -   ANA <80 TITER <80 <80   ASM <20 TITER - -   TPE 6.0 - 8.0 G/DL 6.4 -   [id number] 2 - 6 % 4.4 -       Hepatitis Labs:  Hepatitis Tests Latest Ref Rng & Units 05/18/2015   HBV Core Ab Total - NEG   HBV Surface Ab mIU/ml 67.8   HBV Surface Ag - NEG       Imaging:  No recent abdominal imaging for review.    Procedures:    Liver Biopsy (05/31/2016)    A. Liver, native liver biopsy: ???   Minimally active steatohepatitis with steatosis, lobular inflammation   and very rare ballooned hepatocytes (NAS 3/8).   Focal minimal wispy periportal and sinusoidal fibrosis (Stage 2/4). ???     Comment:   The histopathological changes are most consistent with active   steatohepatitis, although other causes of liver injury should be   clinically and serologically excluded. ???Steatohepatitis is a nonspecific   pattern of injury seen most commonly with morbid obesity, diabetes,   insulin resistance, and alcohol abuse. ???However, it can also be associated   with nutritional causes, metabolic disorders or the result of certain   medications.     ACTIVITY SCORE (NAS):   Steatosis:   1 (5-33%); ???     Lobular Inflammation:   1 (< 8foci/200x);     Hepatocyte Ballooning:   1 (few ballooned hepatocytes);     Total: 3/8  FIBROSIS:   2 Perisinusoidal and porta/periportal     PAS stain shows normal glycogen content and PAS/diastase is negative for unusual intrahepatocyte inclusions. ???Trichrome stain is used to assess the   fibrosis. ???Iron stain is negative for increased hepatic storage iron. ???       Physical Exam  Vitals signs and nursing note reviewed.   Constitutional:       Appearance: Normal appearance. She is obese.   HENT:      Head: Normocephalic and atraumatic.      Nose: Nose normal.      Mouth/Throat:      Mouth: Mucous membranes are moist.      Pharynx: Oropharynx is clear.   Eyes:      Conjunctiva/sclera: Conjunctivae normal.      Pupils: Pupils are equal, round, and reactive to light.   Neck:      Musculoskeletal: Normal range of motion and neck supple.   Cardiovascular:      Rate and Rhythm: Normal rate and regular rhythm.   Pulmonary:      Effort: Pulmonary effort is normal.      Breath sounds: Normal breath sounds.   Abdominal:      General: Bowel sounds are normal. There is no distension.      Palpations: Abdomen is soft. There is no mass.      Tenderness: There is no abdominal tenderness.   Musculoskeletal:         General: No swelling.      Right lower leg: No edema.      Left lower leg: No edema.   Skin:     General: Skin is warm and dry.      Coloration: Skin is not jaundiced.   Neurological:      General: No focal deficit present.      Mental Status: She is alert and oriented to person, place, and time. Mental status is at baseline.   Psychiatric:         Mood and Affect: Mood normal.         Behavior: Behavior normal.         Thought Content: Thought content normal.         Judgment: Judgment normal.          Assessment and Plan:  Ms. Fero is a very pleasant 68 year old presenting to the hepatology clinic for routine follow-up for management of her biopsy-proven Holly Dixon with stage II fibrosis.  She presents to clinic alone today in good functional status and is not using any assistive devices.  She reports since her last visit with Dr. Shea Evans in 2018 she has been doing relatively well.    NASH: ???-  Biopsy proven NASH in 2018.???Minimally active steatohepatitis with steatosis, lobular inflammation   and very rare ballooned hepatocytes (NAS 3/8).   ???-???Discussed???use???of medication for impaired glucose tolerance:???GLP1-agonist; however, in review of patient labs she does not meet criteria to initiate  ???- Discussed addition of daily physical activity. Reports she is active with house chores and helping care for her 8 grandchildren ranging from 1-25yo.   ???  Dietary Counseling:  ???- Discussed Mediterranean style diet and benefit for fatty liver.   ???  Fibrosis Staging:   - Liver biopsy --> 2018: Focal minimal wispy periportal and sinusoidal fibrosis (Stage 2/4).   ???- Fibrosis Staging   - Plan to repeat FibroScan at 2021 clinic visit.       Health Maintenance:    - Last colonoscopy complete  with normal results per patient report. Recommendation to return in 5 years.    - Plan to follow up with PCP for GI referral.      Bone Health:???   - Due to underlying liver disease, the patient has increased risk for premature bone loss.???Recommend DEXA for evaluation of osteoporosis/osteopenia. Noted osteopenia in 2017.  ???- Recommend periodic vitamin D level monitoring on a 6 month to annual basis.   ???- Recommendation of daily Vit D 2000IU and Calcium 1200mg  supplementation after completion of high dose therapy.   ???  Immunizations:   We strongly recommend vaccinations for hepatitis A and B if the patient not immune.      Right Foot Pain:   - Continued pain and intermittent swelling. Reports nerve damage from previous injury.   - Referral to ortho.      Follow Up:   1 year or sooner if needed for clinic follow up. Repeat labs in 6 months. The patient is to call our office with any questions  or changes to your health condition.     Labs today: MELD (CBC +Diff, CMP, INR). Repeat in 6 months to include Vitamin D + Lipid panel.     Imaging/biopsy: N/A     Procedures: N/A     Referrals: Dr. Wilkie Aye Thank you very much for the opportunity to participate in the care of this patient.  If you have any further questions, please don't hesitate to contact our office.    _____________________________  Marcelyn Bruins, APRN-C  Hepatology & Liver Transplantation  [facility]  [phone]  P- [phone]

## 2018-07-26 NOTE — Progress Notes
RN reviewed. Plan to continue every 6 month lab monitoring.   Eda Keys RN, BSN

## 2018-07-30 ENCOUNTER — Encounter: Admit: 2018-07-30 | Discharge: 2018-07-30

## 2018-07-30 DIAGNOSIS — K7581 Nonalcoholic steatohepatitis (NASH): Secondary | ICD-10-CM

## 2018-07-30 DIAGNOSIS — E559 Vitamin D deficiency, unspecified: Secondary | ICD-10-CM

## 2018-07-30 NOTE — Progress Notes
Records requested from [facility]) for colonoscopy. Will await records.  Sonia Side, LPN

## 2018-09-30 ENCOUNTER — Encounter: Admit: 2018-09-30 | Discharge: 2018-09-30

## 2018-09-30 NOTE — Progress Notes
Made second request to get any colonoscopy report from [facility]. Will await records.  Sonia Side, LPN

## 2018-10-01 ENCOUNTER — Encounter: Admit: 2018-10-01 | Discharge: 2018-10-01

## 2018-10-25 ENCOUNTER — Encounter: Admit: 2018-10-25 | Discharge: 2018-10-25

## 2018-10-25 DIAGNOSIS — K7581 Nonalcoholic steatohepatitis (NASH): Secondary | ICD-10-CM

## 2018-10-29 ENCOUNTER — Encounter: Admit: 2018-10-29 | Discharge: 2018-10-29

## 2018-10-29 DIAGNOSIS — K7581 Nonalcoholic steatohepatitis (NASH): Secondary | ICD-10-CM

## 2018-10-29 LAB — COMPREHENSIVE METABOLIC PANEL
Lab: 72
Lab: 0.4 mg/dL (ref 0.2–1.2)
Lab: 104 mmol/L (ref 98–110)
Lab: 115 mg/dL — ABNORMAL HIGH (ref 65–99)
Lab: 140 mmol/L (ref 135–146)
Lab: 196 U/L — ABNORMAL HIGH (ref 37–153)
Lab: 23 U/L (ref 10–35)
Lab: 25 mmol/L (ref 20–32)
Lab: 30 U/L — ABNORMAL HIGH (ref 6–29)
Lab: 4 mmol/L (ref 3.5–5.3)
Lab: 4.1 g/dL (ref 3.6–5.1)
Lab: 6.3 g/dL (ref 6.1–8.1)
Lab: 8.9 mg/dL (ref 8.6–10.4)

## 2018-10-29 LAB — PROTIME INR (PT)
Lab: 2.5 mg/dL — ABNORMAL HIGH (ref 0.9–1.1)
Lab: 25 mg/dL — ABNORMAL HIGH (ref 9.0–11.5)

## 2018-10-29 NOTE — Progress Notes
RN reviewed. Alk phos and ALT remain elevated.  Eda Keys RN, BSN

## 2019-07-07 ENCOUNTER — Encounter: Admit: 2019-07-07 | Discharge: 2019-07-07 | Payer: MEDICARE

## 2019-07-07 NOTE — Telephone Encounter
SWP's spouse. Informed pt's spouse about Dr. Telford Nab clinic cancellation on 6/11. Provided pt's new appt date/time. Pt's spouse v/u this change and stated he would inform pt.

## 2019-08-07 ENCOUNTER — Encounter: Admit: 2019-08-07 | Discharge: 2019-08-07 | Payer: MEDICARE

## 2019-08-08 ENCOUNTER — Ambulatory Visit: Admit: 2019-08-08 | Discharge: 2019-08-08 | Payer: MEDICARE

## 2019-08-08 ENCOUNTER — Encounter: Admit: 2019-08-08 | Discharge: 2019-08-08 | Payer: MEDICARE

## 2019-08-08 DIAGNOSIS — M329 Systemic lupus erythematosus, unspecified: Secondary | ICD-10-CM

## 2019-08-08 DIAGNOSIS — M797 Fibromyalgia: Secondary | ICD-10-CM

## 2019-08-08 DIAGNOSIS — K7581 Nonalcoholic steatohepatitis (NASH): Principal | ICD-10-CM

## 2019-08-08 DIAGNOSIS — E559 Vitamin D deficiency, unspecified: Secondary | ICD-10-CM

## 2019-08-08 DIAGNOSIS — K769 Liver disease, unspecified: Secondary | ICD-10-CM

## 2019-08-08 LAB — CBC AND DIFF
Lab: 0 10*3/uL (ref 0–0.20)
Lab: 0.2 10*3/uL (ref 0–0.45)
Lab: 0.5 10*3/uL (ref 0–0.80)
Lab: 1 % (ref >60)
Lab: 13 % (ref 11–15)
Lab: 14 g/dL (ref 12.0–15.0)
Lab: 2 10*3/uL (ref 1.0–4.8)
Lab: 24 % — ABNORMAL HIGH (ref 24–44)
Lab: 3 % (ref >60)
Lab: 31 pg (ref 26–34)
Lab: 34 g/dL (ref 32.0–36.0)
Lab: 371 K/UL — ABNORMAL HIGH (ref 150–400)
Lab: 4.6 M/UL — ABNORMAL HIGH (ref 4.0–5.0)
Lab: 42 % (ref 36–45)
Lab: 5.7 10*3/uL (ref 1.8–7.0)
Lab: 6 % (ref 4–12)
Lab: 66 % (ref 41–77)
Lab: 7.2 FL — ABNORMAL HIGH (ref 7–11)
Lab: 8.7 K/UL (ref 4.5–11.0)
Lab: 92 FL (ref 80–100)

## 2019-08-08 LAB — 25-OH VITAMIN D (D2 + D3): Lab: 26 ng/mL — ABNORMAL LOW (ref 30–80)

## 2019-08-08 LAB — PROTIME INR (PT): Lab: 2.2 — ABNORMAL HIGH (ref 0.8–1.2)

## 2019-08-08 LAB — COMPREHENSIVE METABOLIC PANEL
Lab: 143 MMOL/L (ref 137–147)
Lab: 3.9 MMOL/L (ref 3.5–5.1)

## 2019-08-08 NOTE — Progress Notes
Date of Service: 08/08/2019    Subjective:             Holly Dixon is a 69 y.o. female.    History of Present Illness  This is a 69 year old lady with history of obesity with a BMI of 37 and also history of nonspecific dermatitis and fibromyalgia questionable lupus but not on any immunosuppression treatment.  She had a liver biopsy in 2018 that showed Nash with stage II fibrosis.  Overall she has not been able to have significant weight loss and has gained 5 pounds instead.  She did not have a FibroScan previously and we have a FibroScan today that showed liver stiffness of 14.8 consistent with cirrhosis and a CAP score of 300.         Review of Systems  Review of systems as above and per History of Present Illness; otherwise negative for 10 of 14 systems reviewed.    Objective:         ? CALCIUM CARBONATE (CALCIUM 600 PO) Take 1,200 mg by mouth daily.   ? chlorzoxazone(+) (PARAFON FORTE) 500 mg tablet Take 500 mg by mouth four times daily as needed for Muscle Cramps.   ? cholecalciferol (VITAMIN D-3) 1,000 units tablet Take 1,000 Units by mouth daily.   ? desipramine(+) (NORPRAMIN) 25 mg tablet Take 25 mg by mouth twice daily.   ? esomeprazole DR(+) (NEXIUM) 40 mg capsule TAKE 1 CAPSULE BY MOUTH TWICE DAILY BEFORE MEALS. TAKE ON AN EMPTY STOMACH AT LEAST 1 HOUR BEFORE OR 2 HOURS AFTER FOOD   ? furosemide (LASIX) 20 mg tablet Take 20 mg by mouth as Needed.   ? HYDROcodone/acetaminophen(+) (NORCO) 10/325 mg tablet Take 1 Tab by mouth every 6 hours as needed for Pain   ? methadone (DOLOPHINE; METHADOSE) 10 mg tablet Take 10 mg by mouth three times daily   ? potassium chloride SR (K-DUR) 20 mEq tablet Take 20 mEq by mouth daily. Take with a meal and a full glass of water.   ? promethazine (PHENERGAN) 25 mg tablet Take 25 mg by mouth every 6 hours as needed for Nausea or Vomiting.   ? warfarin (COUMADIN) 5 mg tablet Take 5 mg by mouth four times weekly. 2 1/2 three days a week     Vitals:    08/08/19 1032   BP: 131/60   BP Source: Arm, Right Upper   Patient Position: Sitting   Pulse: 91   Temp: 36.4 ?C (97.6 ?F)   TempSrc: Oral   SpO2: 95%   Weight: 99.7 kg (219 lb 12.8 oz)   Height: 162.6 cm (64)   PainSc: Six     Body mass index is 37.73 kg/m?Marland Kitchen     Physical Exam         Assessment and Plan:  Discussed with patient about management of [name] with stage II fibrosis back in 2018 and possible fibrosis progression this time with a liver stiffness of 14.8 kPa.  Of course we have difficulty comparing apples to all range as liver biopsy does not compare directly to FibroScan.  Nevertheless there is a will be of fibrosis progression considering the current liver biopsy is more consistent with cirrhosis.  Therefore my plan is to repeat a liver biopsy at this time.  Patient has a history of pulmonary embolism 1 time 2 years ago and I think we can just hold Coumadin just for 1 week prior to the planned liver biopsy.  Will resume Coumadin 3 days after  the liver biopsy.     Discussed with patient about lifestyle changes necessary for weight loss.  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  I do not think engaging her in an exercise program would be of much help.  Patient needs a diet program.  Specifically she may benefit from the meal replacement program and shakes from [facility] weight loss man program.  Alternatively patient is thinking about joining the weight loss program at [facility].    Spent 35 minutes total with patients and reviewing charts.

## 2019-08-08 NOTE — Progress Notes
Release faxed to pcp office for most recent labs for provider to review.

## 2019-08-11 ENCOUNTER — Encounter: Admit: 2019-08-11 | Discharge: 2019-08-11 | Payer: MEDICARE

## 2019-08-11 DIAGNOSIS — Z20822 Encounter for screening laboratory testing for COVID-19 virus in asymptomatic patient: Secondary | ICD-10-CM

## 2019-08-11 NOTE — Progress Notes
Interventional Radiology Outpatient Scheduling Checklist      1.  Name of Procedure(s):   Liver Biopsy    NOTE:  COVID test scheduled at [facility] in [place]. [place] on 6/22; request fax results to [facility] IR-adrn      2.  Date of Procedure:   08/19/2019      3.  Arrival Time:   1000      4.  Procedure Time:  1100      5.  Correct Procedural Room Assignment:  [facility] Room 1      6.  Blood Thinners Triaged and instructed per protocol: Y/N/NA:  Yes.  Pt instructed to hold Warfarin from 6/24-6/29 and may resume on 6/30.  Pt instructed to call prescribing physician to discuss any questions or concerns with holding this medication.  Pt verbalized understanding.  Confirmed accurate instructions sent to patient: Y/N:  Yes       7.  Procedure Order Verified: Y/N:  Yes      9.  Patient instructed to have a driver: Y/N/NA:  Yes    10.  Patient instructed on NPO status: Y/N/NA:  Yes, 0300 & 0900  Confirmed accurate instructions sent to patient: Y/N:  Yes    11.  Specimen needed: Y/N/NA:  Yes   Verified Order placed: Y/N:  Yes    12.  Allergies Verified:  Y/N:  Yes    13.  Is there an Iodine Allergy: Y/N:  No  Does the Procedure Require contrast: Y/N:  NO  If so, was the IR- Contrast Allergy Pre-Procedure Medication protocol ordered: Y/NA:  NA    14.  Does the patient have labs according to IR Pre-procedure Laboratory Parameter policy: Y/N/NA:  Yes  If No, was the patient instructed to obtain labs prior to procedure: Y/N/NA:  NA     15.  Will the patient need to be admitted or have a possible admission: Y/N:  No  If yes, confirmed accurate instructions sent to patient: Y/N/NA:  NA     16.  Patient States Understanding: Y/N:  Yes    17.  History of OSA:  Y/N:  No  If yes, confirm request to bring CPAP sent to patient: Y/N/NA:  NA    18. Patient declines electronic procedure instructions: Y/N:  No; my chart

## 2019-08-11 NOTE — Progress Notes
Due to upcoming scheduled procedure, the patient has been ordered to schedule a COVID-19 PCR Swab test to be completed prior to the scheduled procedure on 08/19/2019.      Per our phone discussion, you may obtain your COVID test locally. [facility] has sent a COVID test order to [facility] in [place]. [place], [place].  Please contact [facility] at [phone] to schedule the COVID PCR swab test on 6/22 or 6/23.Marland Kitchen     Please ask them to fax the COVID test results to [facility] Attention: Marylene Land at fax: [phone] prior to your procedure scheduled on 08/19/2019.      >Please bring your identification and insurance card with you for this appointment.        Patient verbalized understanding. No further questions at this time.     Dorris Fetch, BSN, RN, [facility]

## 2019-08-11 NOTE — Patient Education
Dear Ms. Holly Dixon,     Thank you for choosing [facility] Interventional Radiology for your procedure. Your appointment information is listed below:    Appointment Date: 08/19/2019  Appointment Time: 11:00 AM  Arrival Time: 10:00 AM  Location:        ? [facility]: [address] [place], [place] [address]   Parking: available in the front of the building        INTERVENTIONAL RADIOLOGY  PRE-PROCEDURE INSTRUCTIONS SEDATION    You are scheduled for a procedure in Interventional Radiology with procedural sedation.  Please follow these instructions and any direction from your Primary Care/Managing Physician.  If you have questions about your procedure or need to reschedule please call [phone].    Medication Instructions:   You may take the following medications with a small sip of water:  <ALL MEDICATIONS EXCEPT WARFARIN.>     Do not take the following medications: <DO NOT TAKE WARFARIN FROM 6/24-6/29/2021.  YOU MAY RESUME WARFARIN THE DAY AFTER THE PROCEDURE ON 6/30.  PLEASE CONTACT YOUR PRESCRIBING PHYSICIAN TO DISCUSS ANY CONCERNS WITH HOLDING THIS MEDICATION.>      Diet Instructions:  a. (8) hours before your procedure (3:00 AM), stop your regular diet and start a clear liquid diet.  b. (6) hours before your procedure, discontinue tube feedings and chewing tobacco.  c. (2) hours before your procedure (9:00 AM) discontinue clear liquids.  You should have nothing by mouth. This includes GUM or CANDY.     Clear Liquid Diet    Water  Apple or White Grape Juice  Coffee or tea without cream   Tea  White Cranberry Juice  Chicken Bouillon or Broth (no noodles)  Soda Pop  Popsicles   Beef Bouillon or Broth (no noodles)    Day of Exam Instructions:  1. Bathe or shower with an antibacterial soap prior to your appointment.  2. If you have a history of Obstructive Sleep Apnea (OSA) bring your CPAP/BIPAP.   3. Bring a list of your current medications and the dosages.  4. Wear comfortable clothing and leave valuables at home.  5. Arrive (1) hour prior to your appointment.  This time will be spent registering, interviewing, assessing, educating and preparing you for the test.  ? You will be with [place] anywhere from 30 minutes to 6 hours after your exam depending on your procedure.  6. You may be sedated for the procedure. A responsible adult must drive you home (no Bened[employer] Goad, taxis or buses are allowed) and stay with you overnight. If you do not have a driver we will be unable to perform your procedure.   7. You will not be able to return to work or drive the same day if receiving sedation.

## 2019-08-14 ENCOUNTER — Encounter: Admit: 2019-08-14 | Discharge: 2019-08-14 | Payer: MEDICARE

## 2019-08-14 DIAGNOSIS — Z20822 Encounter for screening laboratory testing for COVID-19 virus in asymptomatic patient: Secondary | ICD-10-CM

## 2019-08-19 ENCOUNTER — Encounter: Admit: 2019-08-19 | Discharge: 2019-08-19 | Payer: MEDICARE

## 2019-08-19 ENCOUNTER — Ambulatory Visit: Admit: 2019-08-19 | Discharge: 2019-08-19 | Payer: MEDICARE

## 2019-08-19 DIAGNOSIS — I2699 Other pulmonary embolism without acute cor pulmonale: Secondary | ICD-10-CM

## 2019-08-19 DIAGNOSIS — K7581 Nonalcoholic steatohepatitis (NASH): Secondary | ICD-10-CM

## 2019-08-19 DIAGNOSIS — M797 Fibromyalgia: Secondary | ICD-10-CM

## 2019-08-19 DIAGNOSIS — K769 Liver disease, unspecified: Secondary | ICD-10-CM

## 2019-08-19 DIAGNOSIS — M329 Systemic lupus erythematosus, unspecified: Secondary | ICD-10-CM

## 2019-08-19 MED ORDER — SODIUM CHLORIDE 0.9 % IV SOLP
0 refills | Status: CP
Start: 2019-08-19 — End: ?
  Administered 2019-08-19: 15:00:00 100 mL/h via INTRAVENOUS

## 2019-08-19 MED ORDER — MIDAZOLAM 1 MG/ML IJ SOLN
1 mg | Freq: Once | INTRAVENOUS | 0 refills | Status: CP
Start: 2019-08-19 — End: ?
  Administered 2019-08-19: 15:00:00 1 mg via INTRAVENOUS

## 2019-08-19 MED ORDER — FENTANYL CITRATE (PF) 50 MCG/ML IJ SOLN
0 refills | Status: CP
Start: 2019-08-19 — End: ?
  Administered 2019-08-19: 16:00:00 50 ug via INTRAVENOUS

## 2019-08-19 MED ORDER — MIDAZOLAM 1 MG/ML IJ SOLN
0 refills | Status: CP
Start: 2019-08-19 — End: ?
  Administered 2019-08-19 (×2): 1 mg via INTRAVENOUS

## 2019-08-19 NOTE — Progress Notes
Sedation physician present in room. Recent vitals and patient condition reviewed between sedating physician and nurse. Reassessment completed. Determination made to proceed with planned sedation.

## 2019-08-19 NOTE — H&P (View-Only)
IR Pre-Procedure History and Physical/Sedation Plan    Procedure Date: 08/19/2019     Planned Procedure(s):  Liver biopsy    Procedural code status: No Order    Indication:  NASH  __________________________________________________________________    Chief Complaint:  Elevated LFTS     History of Present Illness: Holly Dixon is a 69 y.o. female with a history as listed below who presents today for procedure. She denies concerns and has had multiple liver bx before.     Patient Active Problem List    Diagnosis Date Noted   ? NASH (nonalcoholic steatohepatitis) 07/28/2018   ? Vitamin D deficiency 07/28/2018   ? GERD (gastroesophageal reflux disease) 02/04/2016   ? 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 Nausea 02/04/2016   ? Abdominal pain 02/04/2016   ? Chronic neck pain 12/31/2015   ? Peripheral cyanosis 12/31/2015   ? Numbness and tingling sensation of skin 09/09/2015   ? Dyspnea 06/15/2015   ? Dysphagia 06/15/2015   ? Chronic liver disease 05/18/2015   ? Dermatitis 05/18/2015   ? Fibromyalgia 05/18/2015   ? Chronic fatigue 05/18/2015   ? Myalgia 05/18/2015   ? 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 Fibromyalgia    ? Liver problem    ? Lupus (HCC)    ? Pulmonary embolism [facility])       Surgical History:   Procedure Laterality Date   ? ESOPHAGOGASTRODUODENOSCOPY N/A 10/06/2015    Performed by Michelene Heady, MBBS at [facility] ENDO   ? ESOPHAGOGASTRODUODENOSCOPY BIOPSY N/A 10/06/2015    Performed by Michelene Heady, MBBS at [facility] ENDO   ? EGD with biopsies for f/u on EOE N/A 03/09/2016    Performed by Eliott Nine, MD at [facility] ENDO   ? ESOPHAGOGASTRODUODENOSCOPY BIOPSY  03/09/2016    Performed by Eliott Nine, MD at [facility] ENDO   ? APPENDECTOMY     ? BREAST REDUCTION Bilateral    ? ERCP     ? FOOT SURGERY     ? HX CHOLECYSTECTOMY     ? KNEE SURGERY     ? LIVER BIOPSY     ? NECK SURGERY Bilateral    ? SHOULDER SURGERY        Social History     Tobacco Use   ? Smoking status: Never Smoker   ? Smokeless tobacco: Never Used   Substance Use Topics   ? Alcohol use: No     Alcohol/week: 0.0 standard drinks      Family History   Problem Relation Age of Onset   ? Diabetes Mother    ? Arthritis Mother    ? COPD Mother    ? Heart Attack Father    ? Melanoma Neg Hx       Medications Prior to Admission   Medication Sig Dispense Refill Last Dose   ? CALCIUM CARBONATE (CALCIUM 600 PO) Take 1,200 mg by mouth daily.   08/18/2019   ? chlorzoxazone(+) (PARAFON FORTE) 500 mg tablet Take 500 mg by mouth four times daily as needed for Muscle Cramps.   08/19/2019   ? cholecalciferol (VITAMIN D-3) 1,000 units tablet Take 1,000 Units by mouth daily.   08/18/2019   ? desipramine(+) (NORPRAMIN) 25 mg tablet Take 25 mg by mouth twice daily.   08/18/2019   ? esomeprazole DR(+) (NEXIUM) 40 mg capsule TAKE 1 CAPSULE BY MOUTH TWICE DAILY BEFORE MEALS. TAKE ON AN EMPTY STOMACH  AT LEAST 1 HOUR BEFORE OR 2 HOURS AFTER FOOD 60 capsule 0 08/18/2019   ? furosemide (LASIX) 20 mg tablet Take 20 mg by mouth as Needed.   08/18/2019   ? HYDROcodone/acetaminophen(+) (NORCO) 10/325 mg tablet Take 1 Tab by mouth every 6 hours as needed for Pain   08/19/2019   ? methadone (DOLOPHINE; METHADOSE) 10 mg tablet Take 10 mg by mouth three times daily   08/19/2019   ? potassium chloride SR (K-DUR) 20 mEq tablet Take 20 mEq by mouth daily. Take with a meal and a full glass of water.   08/18/2019   ? promethazine (PHENERGAN) 25 mg tablet Take 25 mg by mouth every 6 hours as needed for Nausea or Vomiting.   08/18/2019   ? warfarin (COUMADIN) 5 mg tablet Take 5 mg by mouth four times weekly. 2 1/2 three days a week   08/14/2019     Allergies   Allergen Reactions   ? Cymbalta [Duloxetine] NAUSEA ONLY     abdominal pain   ? Lyrica [Pregabalin] NAUSEA ONLY     abdominal pain        Review of Systems  A comprehensive review of systems was negative.    Previous Personal Anesthetic/Sedation History:  Denies adverse events related to sedation/anesthesia.     Previous Family Anesthetic/Sedation History: Denies adverse events related to sedation/anesthesia.    Physical Exam:  Vital Signs: Last Filed In 24 Hours Vital Signs: 24 Hour Range   BP: 146/88 (06/29 0953)  Temp: 36.9 ?C (98.4 ?F) (06/29 1610)  Pulse: 81 (06/29 0953)  Respirations: 18 PER MINUTE (06/29 0953)  SpO2: 99 % (06/29 0953)  Height: 162.6 cm (64) (06/29 0953) BP: (146)/(88)   Temp:  [36.9 ?C (98.4 ?F)]   Pulse:  [81]   Respirations:  [18 PER MINUTE]   SpO2:  [99 %]    Intensity Pain Scale (Self Report): 6 (08/19/19 0953)      General appearance: Alert and no distress noted.  Neurologic: Grossly normal.  Lungs: Non labored at rest.  Heart: Regular rate and rhythm  Abdomen: Non-distended    Airway: airway assessment performed  Mallampati II (soft palate, uvula, fauces visible)  Head and Neck: no abnormalities noted  Mouth: no abnormalities noted  NPO status: Acceptable  Pregnancy Status: Not Pregnant  Anesthesia Classification:  ASA III (A patient with a severe systemic disease that limits activity, but is not incapacitating)  Pre-operative anxiolysis Plan: Midazolam  Sedation/Medication Plan: Fentanyl, Lidocaine and Midazolam  Discussion/Reviews:  Physician has discussed risks and alternatives of this type of sedation and above planned procedures with patient    Lab/Radiology/Other Diagnostic Tests:  Labs:  Pertinent labs reviewed           Pollyann Kennedy, APRN-NP  Pager [phone]

## 2019-08-19 NOTE — Patient Instructions
INTERVENTIONAL RADIOLOGY DISCHARGE INSTRUCTIONS  PERCUTANEOUS LIVER BIOPSY?  A percutaneous liver biopsy is a procedure in which a tiny sample of your liver tissue is taken by using a needle inserted through your skin into your liver.? The radiologist will determine whether the procedure is to be done using ultrasound or CT guidance.?   AFTER YOUR PROCEDURE:  ? You will recover in Interventional Radiology for a minimum 2 hours after your procedure.  ? You will be on bedrest with bathroom privileges after the procedure.?  POST-PROCEDURE PAIN:   ? Pain control following your procedure is a priority for both you and your Physicians.  ? Some soreness or tenderness at the site is to be expected for several days. We recommend taking over the counter analgesics to help relieve this pain.  ? Alternative methods for pain relief include but not limited to heat or cold compress, relaxation techniques, rest, and changing of positions.?  ? If pain continues after 5-7 days or you have severe pain not relieved by medication, please contact us as directed below.?  POST-PROCEDURE ACTIVITY:   ? A responsible adult must drive you home. ???  ? If you receive sedation, narcotic pain medication or anesthesia for the procedure, you should not drive or operate heavy machinery or do anything that requires concentration for at least 24 hours after procedure completion.  ? It is recommended that a responsible adult be with you until morning.  ? Avoid lifting more than 5 lbs. for 1 week and avoid exercises that use your abdominal muscles.? Also avoid pushing, pulling or straining.??  POST-PROCEDURE SITE CARE:   ? You will have a small bandage over the procedure site.? Keep this dry.?  ? You may remove it in 24 hours.  ? You may shower in 24 hours, after removing the bandage.  ? Do not submerge the procedure site for 1 week (no bathtub, swimming, hot tub, etc.)  ? Do not use ointments, creams or powders on the puncture site.  ? Be sure your hands are clean when touching near the site.?  DIET/MEDICATIONS:   ? You may resume your previous diet 1-2 hours after the procedure.  ? If you receive sedation or narcotic pain medications, avoid any foods or beverages containing alcohol for at least 24 hours after the procedure.  ? Please see the Medication Reconciliation sheet for instructions on resuming your home medications.??  CALL THE DOCTOR IF:?  ? You have persistent nausea or vomiting.  ? You have new or worsening belly swelling or bloating.  ? Bright red blood soaks the bandage.  ? You have pain not relieved by medication.? Some soreness at the site is to be expected.  ? You have signs of infection such as: fever greater than 101F, chills, redness, warmth, swelling, drainage or pus from the puncture site.?  ?For any of the above symptoms or for problems or concerns related to the procedure, call?[phone] Monday-Friday from 7-5p.? After-hours and weekends, please call?[phone] and ask for the Interventional Radiology Resident on-call.  YOU OR YOUR CAREGIVER SHOULD CALL 911 FOR ANY SEVERE SYMPTOMS SUCH AS EXCESSIVE BLEEDING, SEVERE DIZZINESS, TROUBLE BREATHING OR LOSS OF CONSCIOUSNESS.  ?  ?

## 2019-08-19 NOTE — Other
Immediate Post Procedure Note    Date:  08/19/2019                                         Attending Physician:  Peter Garter, MD    Consent:  Consent obtained from patient.  Time out performed: Consent obtained, correct patient verified, correct procedure verified, correct site verified, patient marked as necessary.  Pre/Post Procedure Diagnosis/Indication:  Liver fibrosis    Anesthesia: conscious  Procedure(s):  Percutaneous Liver Biopsy. Please see separate PACS dictation for further detail.  Findings: Increased hepatic echogenicity consistent with fatty replacement. Uncomplicated biopsy.    Estimated Blood Loss:  None/Negligible  Specimen(s) Removed/Disposition:  Two 18 gauge cores.  Complications: None  Patient Tolerated Procedure: Well  Post-Procedure Condition:  Stable    Peter Garter MD

## 2019-09-29 ENCOUNTER — Ambulatory Visit: Admit: 2019-09-29 | Discharge: 2019-09-30 | Payer: MEDICARE

## 2019-09-29 ENCOUNTER — Encounter: Admit: 2019-09-29 | Discharge: 2019-09-29 | Payer: MEDICARE

## 2019-09-29 DIAGNOSIS — K769 Liver disease, unspecified: Secondary | ICD-10-CM

## 2019-09-29 DIAGNOSIS — I2699 Other pulmonary embolism without acute cor pulmonale: Secondary | ICD-10-CM

## 2019-09-29 DIAGNOSIS — M797 Fibromyalgia: Secondary | ICD-10-CM

## 2019-09-29 DIAGNOSIS — M329 Systemic lupus erythematosus, unspecified: Secondary | ICD-10-CM

## 2019-11-24 ENCOUNTER — Encounter: Admit: 2019-11-24 | Discharge: 2019-11-24 | Payer: MEDICARE

## 2020-07-12 ENCOUNTER — Encounter: Admit: 2020-07-12 | Discharge: 2020-07-12 | Payer: MEDICARE

## 2020-08-25 ENCOUNTER — Encounter: Admit: 2020-08-25 | Discharge: 2020-08-25 | Payer: MEDICARE

## 2020-08-25 NOTE — Telephone Encounter
Pt called wanting to reschedule her TeleHealth appt to an in-person appt. Please call her back.

## 2020-08-25 NOTE — Telephone Encounter
Spoke to [place], scheduled appt for 09/29/20 w/ Chari Manning

## 2020-09-29 ENCOUNTER — Encounter: Admit: 2020-09-29 | Discharge: 2020-09-29 | Payer: MEDICARE

## 2020-09-29 ENCOUNTER — Ambulatory Visit: Admit: 2020-09-29 | Discharge: 2020-09-29 | Payer: MEDICARE

## 2020-09-29 DIAGNOSIS — R799 Abnormal finding of blood chemistry, unspecified: Secondary | ICD-10-CM

## 2020-09-29 DIAGNOSIS — K740 Hepatic fibrosis: Secondary | ICD-10-CM

## 2020-09-29 DIAGNOSIS — M329 Systemic lupus erythematosus, unspecified: Secondary | ICD-10-CM

## 2020-09-29 DIAGNOSIS — K7581 Nonalcoholic steatohepatitis (NASH): Secondary | ICD-10-CM

## 2020-09-29 DIAGNOSIS — Z713 Dietary counseling and surveillance: Secondary | ICD-10-CM

## 2020-09-29 DIAGNOSIS — I2699 Other pulmonary embolism without acute cor pulmonale: Secondary | ICD-10-CM

## 2020-09-29 DIAGNOSIS — K769 Liver disease, unspecified: Secondary | ICD-10-CM

## 2020-09-29 DIAGNOSIS — M797 Fibromyalgia: Secondary | ICD-10-CM

## 2020-09-29 LAB — COMPREHENSIVE METABOLIC PANEL
ALBUMIN: 4.1 g/dL (ref 3.5–5.0)
ALK PHOSPHATASE: 154 U/L — ABNORMAL HIGH (ref 25–110)
ALT: 31 U/L (ref 7–56)
ANION GAP: 12 (ref 3–12)
AST: 23 U/L (ref 7–40)
CALCIUM: 9.5 mg/dL (ref 8.5–10.6)
CHLORIDE: 103 MMOL/L (ref 98–110)
CO2: 25 MMOL/L (ref 21–30)
EGFR: >60 mL/min (ref >60)
GLUCOSE,PANEL: 118 mg/dL — ABNORMAL HIGH (ref 70–100)
POTASSIUM: 4.1 MMOL/L (ref 3.5–5.1)
SODIUM: 140 MMOL/L (ref 137–147)
TOTAL BILIRUBIN: 0.4 mg/dL (ref 0.3–1.2)
TOTAL PROTEIN: 6.3 g/dL (ref 6.0–8.0)

## 2020-09-29 LAB — 25-OH VITAMIN D (D2 + D3): VITAMIN D (25-OH) TOTAL: 54 ng/mL (ref 30–80)

## 2020-09-29 LAB — PROTIME INR (PT)
INR: 3 % — ABNORMAL HIGH (ref 0.8–1.2)
PROTIME: 35 s — ABNORMAL HIGH (ref 9.5–14.2)

## 2020-09-29 LAB — CBC AND DIFF
MCHC: 33 g/dL (ref 32.0–36.0)
MCV: 88 FL (ref 80–100)
MPV: 6.8 FL — ABNORMAL LOW (ref 7–11)
PLATELET COUNT: 380 K/UL (ref 150–400)
RBC COUNT: 4.9 M/UL (ref 4.0–5.0)
WBC COUNT: 6.4 K/UL (ref 4.5–11.0)

## 2020-09-29 LAB — HEMOGLOBIN A1C: HEMOGLOBIN A1C: 5.9 % — ABNORMAL HIGH (ref 4.0–6.0)

## 2020-09-29 NOTE — Progress Notes
Date of Service: 09/29/2020     Subjective:            History of Present Illness         Holly Dixon is a 70 y.o. 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     Please refer to additional hepatology documentation noted on 09/29/19 for additional pertinent history.    [facility]  Since seen in clinic on 09/29/19, patient reports she has been diagnosed with uterine cancer and has recently completed 7 radiation treatments. She has moved to [place], [place] and follows closely with oncology there. Additionally, she plans to have a procedure to help with nerve pain in her right foot/lower leg but has had to postpone it due to an injury in her left foot, as well as waiting to be cleared by her oncologist.     She denies any brain fog/confusion, vomiting, blood in her stool. She does experience nausea when her lupus flares and describes occasions where her liver is flaring, because she will feel persistent RUQ discomfort. She reports the last time this occurred was Fall 2021 after her hepatology visit, and that the discomfort lasted about 30 days and resolved on it's own.  She reports being very active daily with no formal exercise but with chores and remodeling a 1930s farm house. She notes her weight has been stable at ~200lbs for the last 15 years regardless of her activity level of diet. She reports her diet could be improved. Denies alcohol, tobacco, and other substance use. Denies other symptoms or concerns.    She has been having laboratory work done every 3 months through her oncologist but these labs are not available to [place] at this time. Her labs from 08/08/19 show AST 48 ALT 105 alk phos 173, vitamin D 26.9. Of note, she is on Warfarin for history of pulmonary embolism, so her INR is elevated. She notes her PCP has plans to discontinue this medication. Labs will be updated today.       Review of Systems  A complete 10 point review of systems was negative except those listed in the HPI.    Reviewed and updated active medication and allergy list.     ? CALCIUM CARBONATE (CALCIUM 600 PO) Take 1,200 mg by mouth daily.   ? chlorzoxazone(+) (PARAFON FORTE) 500 mg tablet Take 500 mg by mouth four times daily as needed for Muscle Cramps.   ? cholecalciferol (VITAMIN D-3) 1,000 units tablet Take 1,000 Units by mouth daily.   ? desipramine(+) (NORPRAMIN) 25 mg tablet Take 25 mg by mouth twice daily.   ? esomeprazole DR(+) (NEXIUM) 40 mg capsule TAKE 1 CAPSULE BY MOUTH TWICE DAILY BEFORE MEALS. TAKE ON AN EMPTY STOMACH AT LEAST 1 HOUR BEFORE OR 2 HOURS AFTER FOOD   ? furosemide (LASIX) 20 mg tablet Take 20 mg by mouth as Needed.   ? HYDROcodone/acetaminophen(+) (NORCO) 10/325 mg tablet Take 1 Tab by mouth every 6 hours as needed for Pain   ? methadone (DOLOPHINE; METHADOSE) 10 mg tablet Take 10 mg by mouth three times daily   ? potassium chloride SR (K-DUR) 20 mEq tablet Take 20 mEq by mouth daily. Take with a meal and a full glass of water.   ? promethazine (PHENERGAN) 25 mg tablet Take 25 mg by  mouth every 6 hours as needed for Nausea or Vomiting.   ? warfarin (COUMADIN) 5 mg tablet Take 5 mg by mouth four times weekly. 2 1/2 three days a week           Objective:           Vitals:    09/29/20 1044   BP: 132/74   BP Source: Arm, Right Upper   Pulse: 95   SpO2: 97%   PainSc: Zero   Weight: 93.4 kg (206 lb)   Height: 160 cm (5' 3)           Wt Readings from Last 4 Encounters:   09/29/20 93.4 kg (206 lb)   09/29/19 99.3 kg (219 lb)   08/19/19 99.3 kg (219 lb)   08/08/19 99.7 kg (219 lb 12.8 oz)     Body mass index is 36.49 kg/m?Marland Kitchen         MELD-Na score: 19 at 09/29/2020 11:38 AM  MELD score: 19 at 09/29/2020 11:38 AM  Calculated from:  Serum Creatinine: 0.69 MG/DL (Using min of 1 MG/DL) at 4/78/2956 21:30 AM  Serum Sodium: 140 MMOL/L (Using max of 137 MMOL/L) at 09/29/2020 11:38 AM  Total Bilirubin: 0.4 MG/DL (Using min of 1 MG/DL) at 8/65/7846 96:29 AM  INR(ratio): 3.0 at 09/29/2020 11:38 AM  Age: 38 years      Physical Exam    Physical Exam    Vitals reviewed.   Constitutional: Well-developed, well-nourished, in no apparent distress.    HEENT: Normocephalic. no scleral icterus. Mask in place.  Cardiac:  Regular rate and rhythm.    Respiratory: Clear to auscultation bilaterally.  No wheezes or rales.  GI:  Abdomen soft, non-distended, non-tender. No overt hepatosplenomegaly.     Skin:  Skin is warm and dry. No rash noted.  No jaundice. no spider nevi noted.  no palmar erythema.  Peripheral Vascular: trace lower extremity edema.  Musculoskeletal:  ROM intact, no muscle wasting.   Psychiatric: Normal mood and affect. Behavior is normal.  Neuro:  no asterixis, no tremor.    Procedures:    No results found for this or any previous visit. biopsy    No results found for this or any previous visit. EGD    No results found for this or any previous visit. Colon           Assessment/Plan/Recommendations    Metabolic Dysfunction Associated Fatty Liver Disease/NASH:    ? Biopsy 08/19/19 showed steatohepatitis with 60% steatosis, rare ballooned hepatocytes, and lobular inflammation (NAS 4/8). Very minimal zone 3 perisinusoidal delicate fibrosis stage 1A/4).   ? We reviewed the role of uncontrolled metabolic risk factors (i.e. hypertension, hyperlipidemia, diabetes, and untreated obstructive sleep apnea) and the negative impact of these disease process with a concurrent diagnosis of NAFLD  ? We discussed how fat deposits in the liver, how this leads to inflammation, and how chronic inflammation can ultimately lead to scarring and cirrhosis. The long-term complications of fatty liver disease were discussed with the patient, including variable progression to cirrhosis and disease decompensation.   ? Regarding the management of fatty liver disease, discussed an appropriate diet, exercise and weight loss plan. It is recommended to lose 5% body weight in order to reduce steatosis; and 10% is needed to reduce inflammation associated with NASH.  The patient should exercise regularly 3-4 times per week, with an average of 30-45 minutes per day.   ? We discussed the implementation of a healthier diet such as  Mediterranean style diet that is moderately low in carbohydrates and consists of lean proteins (chicken, fish, [place]). We reviewed the recommendation to reduce the amounts of red meat consumption as it has shown higher rates of organ fat deposition.   ? Consider the utility of liberalizing coffee and black tea consumption (1-2 cups daily) as some data supports this may slow progression and reverse effects of NASH-related fibrosis.  ? Patient is getting blood work every 3 months through [facility] rights [facility], will update labs today in clinic.    Transient Elastography (FibroScan):  ? Patient is not fasting today so Transient Elastography was not completed.  ? Discussed fasting prior to next appointment, so we can repeat re-stratification for fibrosis progression/regression at her follow-up appointment next year.       Lower Extremity Edema:   ? Patient is currently on 20mg  lasix daily as prescribed by her PCP.   ? Bilateral lower extremity edema noted to be trace at today's visit.  ? Continue to follow a sodium restricted (<2g sodium diet).      Lipid Management/Screening:    ? Goal LDL in those with NALFD/NASH are < 100 mg/dL or 70mg /dL in individuals with known diabetes.  ? Patient is not on any therapy for lipid management, last lipid panel results unknown.   ? She is not fasting today so lipid panel not updated, but recommended to follow-up with her PCP for screening and management.     Dietary Counseling:    ? Discussed above noted recommendations and encouraged continued lifestyle modifications.       Impaired Glucose Tolerance OR Diabetes Management/Screening:   ? Diabetes management is crucial with ideal goal Hgb A1c goal of =/< 7%.   ? Patient does not have history of diabetes and last A1C is unknown. Plan to update today.   ? Follow with PCP for continued screening and management.     Bone health:   ? All patients with liver disease are at an increased risk for osteopenia/osteoporosis.  We strongly recommend screening for Vitamin D deficiency with aggressive supplementation/replacement, as indicated.   ? Replacement per CFT protocol for values less than 30ng/mL.  Lab Results   Component Value Date/Time    VITD25 54.5 09/29/2020 11:38 AM      ? Recommend calcium +Vitamin D supplementation (2000 IU of Vitamin D and 1200mg  Calcium).   ? Follow up DEXA scans to evaluate for improvement of bone density therapy encouraged.      Vaccinations:  ? Hepatitis A/B (Strongly recommend vaccinations for hepatitis A and B if the patient not immune.)  ? Recommend booster series if low/no immunity. May obtain with our office or with PCP office.   ? Patient is not vaccinated against COVID 19 and denies vaccination at this time.     Health Maintenance & Cancer Screening:  ? Recommend staying up to date for cancer screening: mammograms/PAP for women, prostate cancer screening for men, colon cancer screening for all.  ? Continue to follow with PCP for screening.     Routine Health Care in Patient with Chronic Liver Disease:  ? All patients with liver disease should avoid the use of Non-steroidal Anti-Inflammatory (NSAID) medications as they can cause significant injury to the kidneys in this population.  ? The preferred analgesic in liver disease is Tylenol, up to 2g daily.  ? Recommended avoidance of herbal supplements and over the counter herbal remedies due to potential hepatic toxicities.      Sarcopenia/Nutrition:   ?  With chronic liver disease there is a significant risk of protein malnutrition.    ? Discussed need to change dietary habits to improve overall protein balance including importance of eating between 1.2-1.5g/kg/day lean protein such as plant-based proteins (soy, legumes, nuts), dairy-based proteins (cheese, milk, yogurt), white meat chicken or pork, fish, and eggs.   ? Continue to follow a sodium restricted (<2g sodium diet), heart-healthy, Mediterranean style diet. It is best to minimize the intake of carbohydrates and sugars, to avoid obesity.   ? Strongly encourage protein supplements 2-3 times daily (Boost, Ensure, [employer] Milk, etc.) between meals, or 5-6 times daily for meal replacement, to meet protein and caloric intake.  ? Recommend a bedtime snack with protein and complex carbohydrate to minimize risk of muscle catabolism overnight.      Follow Up:   1 year in clinic with FibroScan exam or sooner if needed. The patient is to call our office with any questions or changes to your health condition.     A total of 40 minutes was spent in the following activities: Preparing to see the patient, Obtaining and/or reviewing separately obtained history, Performing a medically appropriate examination and/or evaluation, Counseling and educating the patient/family/caregiver, Ordering medications, tests, or procedures, Documenting clinical information in the electronic or other health record and Independently interpreting results (not separately reported) and communicating results to the patient/family/caregiver.    Patient/patient family reports that all questions have been answered at this time.  No additional pending concerns.    Thank you very much for the opportunity to participate in the care of this patient.  If you have any further questions, please don't hesitate to contact our office.     _____________________________  Jolee Ewing, APRN, FNP-C  Hepatology & Liver Transplantation  [facility]  [phone]  Fax: [phone]

## 2021-05-17 ENCOUNTER — Encounter: Admit: 2021-05-17 | Discharge: 2021-05-17 | Payer: MEDICARE

## 2021-08-12 ENCOUNTER — Encounter: Admit: 2021-08-12 | Discharge: 2021-08-12 | Payer: MEDICARE

## 2021-08-12 NOTE — Telephone Encounter
Called pt. No answer. Left message advising her 8/4 fibroscan and f/u with Dr. Shea Evans has been cancelled as he will be on rounds that week andhis clinic has been closed. Advised her appt has been rescheduled on F. 8/4 at 2pm with Becca. Left name and number.

## 2021-09-20 ENCOUNTER — Encounter: Admit: 2021-09-20 | Discharge: 2021-09-20 | Payer: MEDICARE

## 2021-09-23 ENCOUNTER — Encounter: Admit: 2021-09-23 | Discharge: 2021-09-23 | Payer: MEDICARE

## 2021-09-23 NOTE — Telephone Encounter
Patient called not feeling well today and need to reschedule her appt. This morning, per teams Pam will call her back after she is off phone. [phone]. Thank you

## 2021-09-28 ENCOUNTER — Encounter: Admit: 2021-09-28 | Discharge: 2021-09-28 | Payer: MEDICARE

## 2021-10-18 ENCOUNTER — Encounter: Admit: 2021-10-18 | Discharge: 2021-10-18 | Payer: MEDICARE

## 2021-10-18 NOTE — Progress Notes
Date of Service: 10/19/2021     Subjective:      History of Present Illness     Holly Dixon is a 71 y.o. 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     Please refer to additional hepatology documentation noted on 09/29/19 for additional pertinent history.    [facility]  Since seen in clinic on 09/29/2020, patient reports doing fair. She underwent complete hysterectomy for her uterine cancer with no evidence of recurrent disease. She additionally had surgery on her right foot that resolved some of her toe pain but did not resolve her nerve pain.     She denies any episodes of brain fog/confusion, abdominal pain, nausea/vomiting, blood in her stools, lower extremity edema, jaundice.     Her BP is slightly elevated today at 143/71, though she reports a long walk from her truck as it will not fit in the garage.     She is currently weaning off of her methadone, as it is a prescription she has been on for 25 years, though no one in [place] will prescribe it for her for pain.     She denies any tobacco, alcohol, and other substance use.     She has had ~12lb weight gain since this time last year, largely due to being less active with her surgeries. She has not made any dietary changes and is following up with her PCP this week for further evaluation of possible pre-diabetes.     She has looked into bariatric surgery in the past and it is not covered by her insurance. She has cut back on her soda intake and stays active around her property, including using her push mower and caring for her horses.     Recent Labs/imaging:   Pertinent labs were reviewed on initiation of note and below:    Lab Results   Component Value Date    TOTPROT 6.3 09/29/2020    ALBUMIN 4.1 09/29/2020    TOTBILI 0.4 09/29/2020    ALKPHOS 154 (H) 09/29/2020    AST 23 09/29/2020    ALT 31 09/29/2020      Lab Results   Component Value Date    NA 140 09/29/2020    K 4.1 09/29/2020    BUN 11 09/29/2020    CR 0.69 09/29/2020      Lab Results   Component Value Date    WBC 6.4 09/29/2020    HGB 14.6 09/29/2020    PLTCT 380 09/29/2020    INR 3.0 (H) 09/29/2020        Computed MELD 3.0 unavailable. Necessary lab results were not found in the last year.  Computed MELD-Na unavailable. Necessary lab results were not found in the last year.             Most recent imaging on 04/26/2021 showed 2.4 cm hypoechoic lesion in the left liver, demonstrated to be focal fatty infiltration on previous MRI from 05/31/2020, unchanged..     Recent labs from 08/09/21 show AST 24 ALT 26 alk phos 170     Review of Systems  A complete 10 point review of systems was negative except those listed in the HPI.    Reviewed and updated active medication and allergy list.     ? CALCIUM CARBONATE (CALCIUM  600 PO) Take 1,200 mg by mouth daily.   ? chlorzoxazone(+) (PARAFON FORTE) 500 mg tablet Take one tablet by mouth four times daily as needed for Muscle Cramps.   ? cholecalciferol (VITAMIN D-3) 1,000 units tablet Take one tablet by mouth daily.   ? desipramine(+) (NORPRAMIN) 25 mg tablet Take one tablet by mouth twice daily.   ? esomeprazole DR(+) (NEXIUM) 40 mg capsule TAKE 1 CAPSULE BY MOUTH TWICE DAILY BEFORE MEALS. TAKE ON AN EMPTY STOMACH AT LEAST 1 HOUR BEFORE OR 2 HOURS AFTER FOOD   ? furosemide (LASIX) 20 mg tablet Take one tablet by mouth as Needed.   ? gabapentin (NEURONTIN) 100 mg capsule Take four capsules by mouth every 8 hours.   ? HYDROcodone/acetaminophen(+) (NORCO) 10/325 mg tablet Take one tablet by mouth every 6 hours as needed for Pain.   ? methadone (DOLOPHINE; METHADOSE) 10 mg tablet Take one tablet by mouth three times daily.   ? naloxone (NARCAN) 4 mg/actuation nasal spray Insert one spray into nose as directed. Insert 1 spray into 1 nostril as needed for signs of opioid overdose then call 911. May repeat dose every 2-3 minutes (alternate nostrils) until medical team arrives.   ? ondansetron HCL (ZOFRAN) 4 mg tablet Take one tablet by mouth every 8 hours as needed for Nausea or Vomiting.   ? potassium chloride SR (K-DUR) 20 mEq tablet Take one tablet by mouth daily. Take with a meal and a full glass of water.   ? promethazine (PHENERGAN) 25 mg tablet Take one tablet by mouth every 6 hours as needed for Nausea or Vomiting.           Objective:           Vitals:    10/19/21 1304   BP: (!) 143/71   BP Source: Arm, Right Upper   Pulse: 81   Temp: 36.8 ?C (98.3 ?F)   SpO2: 98%   TempSrc: Oral   PainSc: Zero   Weight: 99 kg (218 lb 3.2 oz)   Height: 160 cm (5' 3)           Wt Readings from Last 4 Encounters:   10/19/21 99 kg (218 lb 3.2 oz)   09/29/20 93.4 kg (206 lb)   09/29/19 99.3 kg (219 lb)   08/19/19 99.3 kg (219 lb)     Body mass index is 38.65 kg/m?Marland Kitchen         Computed MELD 3.0 unavailable. Necessary lab results were not found in the last year.  Computed MELD-Na unavailable. Necessary lab results were not found in the last year.        Physical Exam    Physical Exam    Vitals reviewed.   Constitutional: Well-developed, well-nourished, patient is obese, in no apparent distress.    HEENT: Normocephalic. no scleral icterus.   Cardiac:  Regular rate and rhythm.    Respiratory: Clear to auscultation bilaterally.  No wheezes or rales.  GI:  Abdomen soft, non-distended, non-tender. No overt hepatosplenomegaly.     Skin:  Skin is warm and dry. No rash noted.  No jaundice. no spider nevi noted.  no palmar erythema.  Peripheral Vascular: trace lower extremity edema.  Musculoskeletal:  ROM intact, no muscle wasting.   Psychiatric: Normal mood and affect. Behavior is normal.  Neuro:  no asterixis, no tremor.           Assessment/Plan/Recommendations    Metabolic Dysfunction Associated Fatty Liver Disease/NASH:    ? Biopsy 08/19/19 showed  steatohepatitis with 60% steatosis, rare ballooned hepatocytes, and lobular inflammation (NAS 4/8). Very minimal zone 3 perisinusoidal delicate fibrosis stage 1A/4).   ? We reviewed the role of uncontrolled metabolic risk factors (i.e. hypertension, hyperlipidemia, diabetes, and untreated obstructive sleep apnea) and the negative impact of these disease process with a concurrent diagnosis of NAFLD  ? We discussed how fat deposits in the liver, how this leads to inflammation, and how chronic inflammation can ultimately lead to scarring and cirrhosis. The long-term complications of fatty liver disease were discussed with the patient, including variable progression to cirrhosis and disease decompensation.   ? Regarding the management of fatty liver disease, discussed an appropriate diet, exercise and weight loss plan. It is recommended to lose 5% body weight in order to reduce steatosis; and 10% is needed to reduce inflammation associated with NASH.  The patient should exercise regularly 3-4 times per week, with an average of 30-45 minutes per day.   ? We discussed the implementation of a healthier diet such as Mediterranean style diet that is moderately low in carbohydrates and consists of lean proteins (chicken, fish, [place]). We reviewed the recommendation to reduce the amounts of red meat consumption as it has shown higher rates of organ fat deposition.   ? Consider the utility of liberalizing coffee and black tea consumption (1-2 cups daily) as some data supports this may slow progression and reverse effects of NASH-related fibrosis.  ? Recent labs from 08/09/21 show AST 24 ALT 26 alk phos 170       Transient Elastography (FibroScan):  ? Fibroscan not attempted today due to patient weight gain since her last appointment and BMI 38.  ? Discussed fasting prior to next appointment, so we can repeat re-stratification for fibrosis progression/regression at her follow-up appointment next year if patient has achieved weight loss. .       Lower Extremity Edema:   ? Patient is currently on 20mg  lasix daily as prescribed by her PCP.   ? Bilateral lower extremity edema noted to be trace at today's visit.  ? Continue to follow a sodium restricted (<2g sodium diet).      Lipid Management/Screening:    ? Goal LDL in those with NALFD/NASH are < 100 mg/dL or 70mg /dL in individuals with known diabetes.  ? Patient is not on any therapy for lipid management, last LDL 162 on 05/17/21.  ? Recommend following with her PCP for further management.     Dietary Counseling:    ? Discussed above noted recommendations and encouraged continued lifestyle modifications.   ? Discussed referral to [facility], though this is not ideal as patient is not local. Declined at this time.   ? Discussed referral to bariatric surgery- patient reports this is not covered by her insurance, declines referral.       Impaired Glucose Tolerance OR Diabetes Management/Screening:   ? Patient does not have history of diabetes and last A1C is 5.9% in 2023.   ? Patient has follow-up to discuss potential pre-diabetes diagnosis with her PCP soon.     Bone health:   ? All patients with liver disease are at an increased risk for osteopenia/osteoporosis.  We strongly recommend screening for Vitamin D deficiency with aggressive supplementation/replacement, as indicated.   ? Replacement per CFT protocol for values less than 30ng/mL.  Lab Results   Component Value Date/Time    VITD25 54.5 09/29/2020 11:38 AM      ? Recommend calcium +Vitamin D supplementation (2000 IU of  Vitamin D and 1200mg  Calcium).   ? Follow up DEXA scans to evaluate for improvement of bone density therapy encouraged.  ? DEXA scan from 02/08/2016 showed osteopenia  ? Recommend following with PCP for further management      Vaccinations:  ? Patient is immune to hepatitis B.  Hepatitis A immunity unknown  ? Recommend booster series if low/no immunity. May obtain with our office or with PCP office.   ? Patient is not vaccinated against COVID 19 and denies vaccination at this time.     Health Maintenance & Cancer Screening:  ? Recommend staying up to date for cancer screening: mammograms/PAP for women, prostate cancer screening for men, colon cancer screening for all.  ? Continue to follow with PCP for screening.     Routine Health Care in Patient with Chronic Liver Disease:  ? All patients with liver disease should avoid the use of Non-steroidal Anti-Inflammatory (NSAID) medications as they can cause significant injury to the kidneys in this population.  ? The preferred analgesic in liver disease is Tylenol, up to 2g daily.  ? Recommended avoidance of herbal supplements and over the counter herbal remedies due to potential hepatic toxicities.      Sarcopenia/Nutrition:   ? With chronic liver disease there is a significant risk of protein malnutrition.    ? Discussed need to change dietary habits to improve overall protein balance including importance of eating between 1.2-1.5g/kg/day lean protein such as plant-based proteins (soy, legumes, nuts), dairy-based proteins (cheese, milk, yogurt), white meat chicken or pork, fish, and eggs.   ? Continue to follow a sodium restricted (<2g sodium diet), heart-healthy, Mediterranean style diet. It is best to minimize the intake of carbohydrates and sugars, to avoid obesity.   ? Strongly encourage protein supplements 2-3 times daily (Boost, Ensure, [employer] Milk, etc.) between meals, or 5-6 times daily for meal replacement, to meet protein and caloric intake.  ? Recommend a bedtime snack with protein and complex carbohydrate to minimize risk of muscle catabolism overnight.      Follow Up:   1 year in clinic with FibroScan exam or sooner if needed. The patient is to call our office with any questions or changes to your health condition.     A total of 40 minutes was spent in the following activities: Preparing to see the patient, Obtaining and/or reviewing separately obtained history, Performing a medically appropriate examination and/or evaluation, Counseling and educating the patient/family/caregiver, Ordering medications, tests, or procedures, Documenting clinical information in the electronic or other health record and Independently interpreting results (not separately reported) and communicating results to the patient/family/caregiver.    Patient/patient family reports that all questions have been answered at this time.  No additional pending concerns.    Thank you very much for the opportunity to participate in the care of this patient.  If you have any further questions, please don't hesitate to contact our office.     _____________________________  Jolee Ewing, APRN, FNP-C  Hepatology & Liver Transplantation  [facility]  [phone]  Fax: [phone]

## 2021-10-19 ENCOUNTER — Encounter: Admit: 2021-10-19 | Discharge: 2021-10-19 | Payer: MEDICARE

## 2021-10-19 ENCOUNTER — Ambulatory Visit: Admit: 2021-10-19 | Discharge: 2021-10-20 | Payer: MEDICARE

## 2021-10-19 DIAGNOSIS — R799 Abnormal finding of blood chemistry, unspecified: Secondary | ICD-10-CM

## 2021-10-19 DIAGNOSIS — K769 Liver disease, unspecified: Secondary | ICD-10-CM

## 2021-10-19 DIAGNOSIS — Z713 Dietary counseling and surveillance: Secondary | ICD-10-CM

## 2021-10-19 DIAGNOSIS — M329 Systemic lupus erythematosus, unspecified: Secondary | ICD-10-CM

## 2021-10-19 DIAGNOSIS — I2699 Other pulmonary embolism without acute cor pulmonale: Secondary | ICD-10-CM

## 2021-10-19 DIAGNOSIS — K7581 Nonalcoholic steatohepatitis (NASH): Secondary | ICD-10-CM

## 2021-10-19 DIAGNOSIS — M797 Fibromyalgia: Secondary | ICD-10-CM

## 2021-10-19 DIAGNOSIS — K740 Hepatic fibrosis: Secondary | ICD-10-CM

## 2022-08-09 ENCOUNTER — Encounter: Admit: 2022-08-09 | Discharge: 2022-08-09 | Payer: MEDICARE

## 2022-10-18 ENCOUNTER — Encounter: Admit: 2022-10-18 | Discharge: 2022-10-18 | Payer: MEDICARE

## 2022-10-18 NOTE — Telephone Encounter
Spoke with patient. She said her car broke down and has to cancel/reschedule her f/u and fibroscan with Becca scheduled today, 8/28. Patient's appt has been rescheduled on 10/27/2022 at 1pm. NPO 4 hours prior. Patient v/u. 8/28 appt has been marked NOS/Late cancellation. Letter has been sent. Call concluded.

## 2022-10-18 NOTE — Telephone Encounter
Patient calling back to r/s her appointment. Call transferred to [facility]. Thank you

## 2022-10-27 ENCOUNTER — Encounter: Admit: 2022-10-27 | Discharge: 2022-10-27 | Payer: MEDICARE

## 2022-10-27 NOTE — Telephone Encounter
Pt called in needing to cancel today's appointment. Please return call to Pt for rescheduling

## 2022-11-01 ENCOUNTER — Encounter: Admit: 2022-11-01 | Discharge: 2022-11-01 | Payer: MEDICARE

## 2022-11-22 NOTE — Progress Notes
Date of Service: 11/24/2022     Subjective:      History of Present Illness     Holly Dixon is a 72 y.o. 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     Please refer to additional hepatology documentation noted on 09/29/19 for additional pertinent history.    [facility]  Since seen in clinic on 10/19/21 patient reports doing poorly, though denies hospitalizations or ER visits.     She continues to have significant nerve pain in her right foot and has an MRI 11/25/22 for further evaluation. She also is having an MRI of her back in coming weeks.  She is currently in a lupus flare as well.     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 However, she has struggled since moving to [place] as they will not prescribe it and are weaning her off. She has had significant chronic pain since this.  GLP-1 agonists have been cost-prohibitive and bariatric surgery is not covered by her insurance.     With her chronic pain her activity levels has been significantly limited and she has gained ~14lb since last seen.  She no longer eats out as she is mostly confined to her home with her pain.     Her blood pressure today was 172/89. She denies any headaches, chest pain, palpitations, vision changes. She did not take any pain medication prior to this visit. At her local provider yesterday her BP was 139/79.     She reports some intermittent abdominal pain, nothing persistent. She has frequent nausea, no vomiting. Denies blood in her stool and reports recent colonoscopy without polyps. Denies lower extremity edema, jaundice.         Recent Labs/imaging:   Pertinent labs were reviewed on initiation of note and below:    Lab Results   Component Value Date    TOTPROT 6.3 09/29/2020    ALBUMIN 4.1 09/29/2020    TOTBILI 0.4 09/29/2020    ALKPHOS 154 (H) 09/29/2020    AST 23 09/29/2020    ALT 31 09/29/2020      Lab Results   Component Value Date    NA 140 09/29/2020    K 4.1 09/29/2020    BUN 11 09/29/2020    CR 0.69 09/29/2020      Lab Results   Component Value Date    WBC 8.2 11/24/2022    HGB 15.1 (H) 11/24/2022    PLTCT 296 11/24/2022    INR 1.1 11/24/2022        Computed MELD 3.0 unavailable. One or more values for this score either were not found within the given timeframe or did not fit some other criterion.  Computed MELD-Na unavailable. One or more values for this score either were not found within the given timeframe or did not fit some other criterion.             Most recent imaging on 04/26/2021 showed 2.4 cm hypoechoic lesion in the left liver, demonstrated to be focal fatty infiltration on previous MRI from 05/31/2020, unchanged..     Recent labs from 08/09/21 show AST 24 ALT 26 alk phos 170     Review of Systems  A complete 10  point review of systems was negative except those listed in the HPI.    Reviewed and updated active medication and allergy list.      CALCIUM CARBONATE (CALCIUM 600 PO) Take 1,200 mg by mouth daily.    chlorzoxazone(+) (PARAFON FORTE) 500 mg tablet Take one tablet by mouth four times daily as needed for Muscle Cramps.    cholecalciferol (VITAMIN D-3) 1,000 units tablet Take one tablet by mouth daily.    desipramine(+) (NORPRAMIN) 25 mg tablet Take one tablet by mouth twice daily.    esomeprazole DR(+) (NEXIUM) 40 mg capsule TAKE 1 CAPSULE BY MOUTH TWICE DAILY BEFORE MEALS. TAKE ON AN EMPTY STOMACH AT LEAST 1 HOUR BEFORE OR 2 HOURS AFTER FOOD    furosemide (LASIX) 20 mg tablet Take one tablet by mouth as Needed.    gabapentin (NEURONTIN) 100 mg capsule Take four capsules by mouth every 8 hours.    methadone (DOLOPHINE; METHADOSE) 10 mg tablet Take one tablet by mouth three times daily.    naloxone (NARCAN) 4 mg/actuation nasal spray Insert one spray into nose as directed. Insert 1 spray into 1 nostril as needed for signs of opioid overdose then call 911. May repeat dose every 2-3 minutes (alternate nostrils) until medical team arrives.    ondansetron HCL (ZOFRAN) 4 mg tablet Take one tablet by mouth every 8 hours as needed for Nausea or Vomiting.    potassium chloride SR (K-DUR) 20 mEq tablet Take one tablet by mouth daily. Take with a meal and a full glass of water.    promethazine (PHENERGAN) 25 mg tablet Take one tablet by mouth every 6 hours as needed for Nausea or Vomiting.           Objective:           Vitals:    11/24/22 1349 11/24/22 1408   BP: (!) 179/95  Comment: asymptomatic (!) 172/89   BP Source: Arm, Right Lower Arm, Right Lower   Pulse: 81 84   SpO2: 97%    PainSc: Nine  Comment: right leg    Weight: 105.2 kg (232 lb)    Height: 160 cm (5' 3)              Wt Readings from Last 4 Encounters:   11/24/22 105.2 kg (232 lb)   10/19/21 99 kg (218 lb 3.2 oz)   09/29/20 93.4 kg (206 lb)   09/29/19 99.3 kg (219 lb)     Body mass index is 41.1 kg/m?Marland Kitchen         Computed MELD 3.0 unavailable. One or more values for this score either were not found within the given timeframe or did not fit some other criterion.  Computed MELD-Na unavailable. One or more values for this score either were not found within the given timeframe or did not fit some other criterion.        Physical Exam    Physical Exam    Vitals reviewed.   Constitutional: Well-developed, well-nourished, patient is obese, in no apparent distress.    HEENT: Normocephalic. no scleral icterus.   Cardiac:  Regular rate and rhythm.    Respiratory: Clear to auscultation bilaterally.  No wheezes or rales.  GI:  Abdomen soft, non-distended, non-tender. No overt hepatosplenomegaly.     Skin:  Skin is warm and dry. No rash noted.  No jaundice. no spider nevi noted.  no palmar erythema.  Peripheral Vascular: trace lower extremity edema.  Musculoskeletal:  ROM intact, no muscle wasting.  Psychiatric: Normal mood and affect. Behavior is normal.  Neuro:  no asterixis, no tremor.           Assessment/Plan/Recommendations    Metabolic Dysfunction Associated Fatty Liver Disease/NASH:    Biopsy 08/19/19 showed steatohepatitis with 60% steatosis, rare ballooned hepatocytes, and lobular inflammation (NAS 4/8). Very minimal zone 3 perisinusoidal delicate fibrosis stage 1A/4).   We reviewed the role of uncontrolled metabolic risk factors (i.e. hypertension, hyperlipidemia, diabetes, and untreated obstructive sleep apnea) and the negative impact of these disease process with a concurrent diagnosis of NAFLD  We discussed how fat deposits in the liver, how this leads to inflammation, and how chronic inflammation can ultimately lead to scarring and cirrhosis. The long-term complications of fatty liver disease were discussed with the patient, including variable progression to cirrhosis and disease decompensation.   Regarding the management of fatty liver disease, discussed an appropriate diet, exercise and weight loss plan. It is recommended to lose 5% body weight in order to reduce steatosis; and 10% is needed to reduce inflammation associated with NASH.  The patient should exercise regularly 3-4 times per week, with an average of 30-45 minutes per day.   We discussed the implementation of a healthier diet such as Mediterranean style diet that is moderately low in carbohydrates and consists of lean proteins (chicken, fish, [place]). We reviewed the recommendation to reduce the amounts of red meat consumption as it has shown higher rates of organ fat deposition.   Consider the utility of liberalizing coffee and black tea consumption (1-2 cups daily) as some data supports this may slow progression and reverse effects of NASH-related fibrosis.  Recent labs from 08/09/21 show AST 24 ALT 26 alk phos 170   MELD labs updated today    Transient Elastography (FibroScan):  Elastography performed with indication of F4 fibrosis and >67% steatosis presence (noted below).    We further recommended  she proceed with a liver biopsy to more definitively determine fibrosis staging and steatosis presence.   Risks of liver biopsy have been reviewed    FibroScan Results (11/24/22):    E (Elasticity) Median: 13.5 kPa   IQR / med.:  12%  CAP Median:  385 dB/m        Lower Extremity Edema:   Patient is currently on 20mg  lasix daily as prescribed by her PCP.   Bilateral lower extremity edema noted to be trace at today's visit.  Continue to follow a sodium restricted (<2g sodium diet).    Hypertension:   BP today 172/89  Patient did not take any pain medications today and is in significant pain with her right foot  She denies any headaches, chest pian, vision changes, palpitations.   Continue to follow with PCP for management      Lipid Management/Screening:    Goal LDL in those with NALFD/NASH are < 100 mg/dL or 70mg /dL in individuals with known diabetes.  Patient is not on any therapy for lipid management, last LDL 162 on 05/17/21.  Recommend following with her PCP for further management.     Dietary Counseling:    Discussed above noted recommendations and encouraged continued lifestyle modifications.   Discussed referral to [facility], though this is not ideal as patient is not local. Declined at this time.   Discussed referral to bariatric surgery- patient reports this is not covered by her insurance, declines referral.       Impaired Glucose Tolerance OR Diabetes Management/Screening:   Patient does not have history of  diabetes and last A1C is 5.9% in 2023.   Patient has follow-up to discuss potential pre-diabetes diagnosis with her PCP soon.     Bone health:   All patients with liver disease are at an increased risk for osteopenia/osteoporosis.  We strongly recommend screening for Vitamin D deficiency with aggressive supplementation/replacement, as indicated.   Replacement per CFT protocol for values less than 30ng/mL.  Lab Results   Component Value Date/Time    VITD25 54.5 09/29/2020 11:38 AM      Recommend calcium +Vitamin D supplementation (2000 IU of Vitamin D and 1200mg  Calcium).   Follow up DEXA scans to evaluate for improvement of bone density therapy encouraged.  DEXA scan from 02/08/2016 showed osteopenia  Recommend following with PCP for further management      Vaccinations:  Patient is immune to hepatitis B.  Hepatitis A immunity unknown  Recommend booster series if low/no immunity. May obtain with our office or with PCP office.   Recommend patient receive annual flu vaccination and stay up-to-date on COVID vaccinations. Recommend staying current on pneumonia vaccinations if patient-appropriate.      Health Maintenance & Cancer Screening:  Recommend staying up to date for cancer screening: mammograms/PAP for women, prostate cancer screening for men, colon cancer screening for all.  Continue to follow with PCP for screening.     Routine Health Care in Patient with Chronic Liver Disease:  All patients with liver disease should avoid the use of Non-steroidal Anti-Inflammatory (NSAID) medications as they can cause significant injury to the kidneys in this population.  The preferred analgesic in liver disease is Tylenol, up to 2g daily.  Recommended avoidance of herbal supplements and over the counter herbal remedies due to potential hepatic toxicities.      Sarcopenia/Nutrition:   With chronic liver disease there is a significant risk of protein malnutrition.    Discussed need to change dietary habits to improve overall protein balance including importance of eating between 1.2-1.5g/kg/day lean protein such as plant-based proteins (soy, legumes, nuts), dairy-based proteins (cheese, milk, yogurt), white meat chicken or pork, fish, and eggs.   Continue to follow a sodium restricted (<2g sodium diet), heart-healthy, Mediterranean style diet. It is best to minimize the intake of carbohydrates and sugars, to avoid obesity.   Strongly encourage protein supplements 2-3 times daily (Boost, Ensure, [employer] Milk, etc.) between meals, or 5-6 times daily for meal replacement, to meet protein and caloric intake.  Recommend a bedtime snack with protein and complex carbohydrate to minimize risk of muscle catabolism overnight.      Follow Up:   6 months in clinic or sooner if needed. The patient is to call our office with any questions or changes to your health condition.     A total of 50 minutes was spent in the following activities: Preparing to see the patient, Obtaining and/or reviewing separately obtained history, Performing a medically appropriate examination and/or evaluation, Counseling and educating the patient/family/caregiver, Ordering medications, tests, or procedures, Documenting clinical information in the electronic or other health record and Independently interpreting results (not separately reported) and communicating results to the patient/family/caregiver.    Patient/patient family reports that all questions have been answered at this time.  No additional pending concerns.    Thank you very much for the opportunity to participate in the care of this patient.  If you have any further questions, please don't hesitate to contact our office.     _____________________________  Jolee Ewing, APRN, FNP-C  Hepatology & Liver  Transplantation  [phone]  Fax: [phone]

## 2022-11-24 ENCOUNTER — Ambulatory Visit: Admit: 2022-11-24 | Discharge: 2022-11-24 | Payer: MEDICARE

## 2022-11-24 ENCOUNTER — Encounter: Admit: 2022-11-24 | Discharge: 2022-11-24 | Payer: MEDICARE

## 2022-11-24 DIAGNOSIS — IMO0002 Lupus: Secondary | ICD-10-CM

## 2022-11-24 DIAGNOSIS — K740 Hepatic fibrosis: Secondary | ICD-10-CM

## 2022-11-24 DIAGNOSIS — K7581 Nonalcoholic steatohepatitis (NASH): Secondary | ICD-10-CM

## 2022-11-24 DIAGNOSIS — I2699 Other pulmonary embolism without acute cor pulmonale: Secondary | ICD-10-CM

## 2022-11-24 DIAGNOSIS — K769 Liver disease, unspecified: Secondary | ICD-10-CM

## 2022-11-24 DIAGNOSIS — Z713 Dietary counseling and surveillance: Secondary | ICD-10-CM

## 2022-11-24 DIAGNOSIS — M797 Fibromyalgia: Secondary | ICD-10-CM

## 2022-11-24 LAB — CBC AND DIFF
ABSOLUTE BASO COUNT: 0 10*3/uL (ref 0–0.20)
ABSOLUTE EOS COUNT: 0.3 10*3/uL (ref 0–0.45)
RBC COUNT: 4.8 M/UL (ref 4.0–5.0)
WBC COUNT: 8.2 10*3/uL (ref 4.5–11.0)

## 2022-11-24 LAB — COMPREHENSIVE METABOLIC PANEL
ALBUMIN: 4.5 g/dL (ref 3.5–5.0)
ALK PHOSPHATASE: 171 U/L — ABNORMAL HIGH (ref 25–110)
ALT: 77 U/L — ABNORMAL HIGH (ref 7–56)
ANION GAP: 11 10*3/uL (ref 3–12)
AST: 48 U/L — ABNORMAL HIGH (ref 7–40)
BLD UREA NITROGEN: 13 mg/dL (ref 7–25)
CALCIUM: 9.1 mg/dL (ref 8.5–10.6)
CO2: 25 MMOL/L (ref 21–30)
CREATININE: 0.7 mg/dL (ref 0.4–1.00)
EGFR: >60 mL/min (ref >60)
POTASSIUM: 4 MMOL/L (ref 3.5–5.1)
SODIUM: 142 MMOL/L — ABNORMAL HIGH (ref 137–147)
TOTAL BILIRUBIN: 0.7 mg/dL (ref 0.2–1.3)
TOTAL PROTEIN: 7.2 g/dL (ref 6.0–8.0)

## 2022-11-24 LAB — PROTIME INR (PT)
INR: 1.1 mg/dL (ref 0.9–1.2)
PROTIME: 11 s (ref 10.2–12.9)

## 2022-11-24 NOTE — Procedures
Fibroscan Procedure Note    Date:11/24/2022    Indication: NAFLD  Probe: XL    Observations  E (Elasticity) Median (kPa): 13.5  IQR / Median (%): 12  CAP Median (dB/m): 385    Interpretation  Fibrosis Interpretation: Other: F4:  Cirrhosis  Steatosis Score Correlation/Estimation: >290   S3: >67% Steatosis      Windy Canny, APRN-NP

## 2022-11-27 NOTE — Patient Education
Dear Holly Dixon,    Thank you for choosing [facility] Interventional Radiology for your procedure. Your appointment information is listed below:    Appointment Date: 12-08-22  Arrival Time: Please arrive for Check-in at: 7am  Appointment Time: 8am  Location:   [facility]: [address] [place], [place] [address]   Parking: available in the front of the building      INTERVENTIONAL RADIOLOGY  PRE-PROCEDURE INSTRUCTIONS SEDATION    You are scheduled for a procedure in Interventional Radiology with procedural sedation.  Please follow these instructions and any direction from your Primary Care/Managing Physician.  If you have questions about your procedure or need to reschedule please call [phone].    Medication Instructions:   You may take the following medications with a small sip of water:  Continue all medications as prescribed before 6am.       Diet Instructions:  (8) hours midnight before your procedure, stop your regular diet and start a clear liquid diet.  (6) hours before your procedure, discontinue tube feedings and chewing tobacco.  (2) hours 6am before your procedure discontinue clear liquids.  You should have nothing by mouth. This includes GUM or CANDY.     Clear Liquid Diet    Water  Apple or White Grape Juice  Coffee or tea without cream   Tea  White Cranberry Juice  Chicken Bouillon or Broth (no noodles)  Soda Pop  Popsicles   Beef Bouillon or Broth (no noodles)    Day of Exam Instructions:  Bathe or shower with an antibacterial soap prior to your appointment.  If you have a history of Obstructive Sleep Apnea (OSA) bring your CPAP/BIPAP.   Bring a list of your current medications and the dosages.  Wear comfortable clothing and leave valuables at home.  Arrive (1) hour prior to your appointment.  This time will be spent registering, interviewing, assessing, educating and preparing you for the test.  You will be with [place] anywhere from 30 minutes to 6 hours after your exam depending on your procedure.  You will be sedated for the procedure. A responsible adult must drive you home (no Benedetto Goad, taxis or buses are allowed) and stay with you overnight. If you do not have a driver we will be unable to perform your procedure.   You will not be able to return to work or drive the same day if receiving sedation.

## 2022-11-27 NOTE — Progress Notes
Interventional Radiology Outpatient Scheduling Checklist      1.  Name of Procedure(s):   LIVER BX      2.  Date of Procedure:   12-08-22      3.  Arrival Time:   700      4.  Procedure Time:  800      5.  Correct Procedural Room Assignment:  ICC 3      6.  Blood Thinners Triaged and instructed per protocol: Y/N/NA:  NA  Confirmed accurate instructions sent to patient: Y/N:  NA       7.  Procedure Order Verified: Y/N:  Yes    8.  Patient instructed to have a driver: Y/N/NA:  Yes    9.  Patient instructed on NPO status: Y/N/NA:  Yes  Confirmed accurate instructions sent to patient: Y/N:  Yes    10.  Specimen needed: Y/N/NA:  Yes   Verified Order placed: Y/N:  Yes    11.  Allergies Verified:  Y/N:  Yes    12.  Has the patient ever had a procedure with contrast (heart cath, CT scan, IVP, etc)? N/A    13. Has the patient ever had an adverse reaction to iodine or iodinated contrast, including but not limited to trouble breathing, swelling around the face or throat, itching, or rash? N/A    14. Has the allergy list been updated? YES    15.  Does the patient have labs according to IR Pre-procedure Laboratory Parameter policy: Y/N/NA:  Yes  If No, was the patient instructed to obtain labs prior to procedure: Y/N/NA:  NA     16.  Will the patient need to be admitted or have a possible admission: Y/N:  No  If yes, confirmed accurate instructions sent to patient: Y/N/NA:  NA     17.  Patient States Understanding: Y/N:  Yes    18.  History of OSA:  Y/N:  No  If yes, confirm request to bring CPAP sent to patient: Y/N/NA:  NA    19. Does the patient have an insulin pump or continuous glucose monitor? N/A,   If yes, was the patient instructed that this will need to be removed for this procedure and to bring supplies to reapply once the procedure is complete? Y/N    20. Patient was sent electronic procedure instructions: Y/N:  Yes     21.  Is patient scheduled with anesthesia?  N/A         Taking GLP-1 agonist (ex. Weekly- Ozempic, Mounjaro, Trulicity, Zepbound, Daily- Victoza, Saxenda, Rybelsus) Y/N         Weekly hold for a week         Daily hold day of procedure

## 2022-12-08 ENCOUNTER — Ambulatory Visit: Admit: 2022-12-08 | Discharge: 2022-12-08 | Payer: MEDICARE

## 2022-12-08 ENCOUNTER — Encounter: Admit: 2022-12-08 | Discharge: 2022-12-08 | Payer: MEDICARE

## 2022-12-08 DIAGNOSIS — K740 Hepatic fibrosis: Secondary | ICD-10-CM

## 2022-12-08 DIAGNOSIS — K7581 Nonalcoholic steatohepatitis (NASH): Secondary | ICD-10-CM

## 2022-12-08 MED ORDER — FENTANYL CITRATE (PF) 50 MCG/ML IJ SOLN
0 refills | Status: CP
Start: 2022-12-08 — End: ?

## 2022-12-08 MED ORDER — MIDAZOLAM 1 MG/ML IJ SOLN
0 refills | Status: CP
Start: 2022-12-08 — End: ?

## 2022-12-08 MED ORDER — MIDAZOLAM 1 MG/ML IJ SOLN
1 mg | Freq: Once | INTRAVENOUS | 0 refills | Status: CP
Start: 2022-12-08 — End: ?
  Administered 2022-12-08: 13:00:00 1 mg via INTRAVENOUS

## 2022-12-08 NOTE — Progress Notes
Sedation physician present in room. Recent vitals and patient condition reviewed between sedating physician and nurse. Reassessment completed. Determination made to proceed with planned sedation.

## 2022-12-08 NOTE — Unmapped
Immediate Post Procedure Note    Date:  12/08/2022                                         Attending Physician:   Val Eagle  Performing Provider:  Dion Saucier, MD    Consent:  Consent obtained from patient.  Time out performed: Consent obtained, correct patient verified, correct procedure verified, correct site verified, patient marked as necessary.  Pre/Post Procedure Diagnosis:  MLD  Indications:  same      Procedure(s):  US guided liver bx  Findings:  echogenic liver     Estimated Blood Loss:  Minimal  Specimen(s) Removed/Disposition:  Yes, sent to pathology  Complications: None  Patient Tolerated Procedure: Well  Post-Procedure Condition:  stable    Dion Saucier, MD  Pager [phone]

## 2022-12-08 NOTE — H&P (View-Only)
Pre Procedure History and Physical/Sedation Plan-OP    Procedure Date: 12/08/2022     Planned Procedure(s):  Liver biopsy    Indication for exam:  Diagnostic testing  __________________________________________________________________    Chief Complaint:  NASH    History of Present Illness: Holly Dixon is a 72 y.o. female who presents for procedure today. Pt complains of generalized pain related to fibromyalgia and lupus.    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    Nausea 16/11/9602    Abdominal pain 02/04/2016    Chronic neck pain 12/31/2015    Peripheral cyanosis 12/31/2015    Numbness and tingling sensation of skin 09/09/2015    Dyspnea 06/15/2015    Dysphagia 06/15/2015    Chronic liver disease 05/18/2015    Dermatitis 05/18/2015    Fibromyalgia 05/18/2015    Chronic fatigue 05/18/2015    Myalgia 05/18/2015    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     Past Medical History:   Diagnosis Date    Fibromyalgia     Liver problem     Lupus     Pulmonary embolism [facility])       Surgical History:   Procedure Laterality Date    ESOPHAGOGASTRODUODENOSCOPY N/A 10/06/2015    Performed by Michelene Heady, MBBS at [facility] ENDO    ESOPHAGOGASTRODUODENOSCOPY BIOPSY N/A 10/06/2015    Performed by Michelene Heady, MBBS at [facility] ENDO    EGD with biopsies for f/u on EOE N/A 03/09/2016    Performed by Eliott Nine, MD at [facility] ENDO    ESOPHAGOGASTRODUODENOSCOPY BIOPSY  03/09/2016    Performed by Eliott Nine, MD at [facility] ENDO    APPENDECTOMY      BREAST REDUCTION Bilateral     ERCP      FOOT SURGERY      HX CHOLECYSTECTOMY      KNEE SURGERY      LIVER BIOPSY      NECK SURGERY Bilateral     SHOULDER SURGERY        Medications Prior to Admission   Medication Sig Dispense Refill Last Dose    CALCIUM CARBONATE (CALCIUM 600 PO) Take 1,200 mg by mouth daily.       chlorzoxazone(+) (PARAFON FORTE) 500 mg tablet Take one tablet by mouth four times daily as needed for Muscle Cramps.       cholecalciferol (VITAMIN D-3) 1,000 units tablet Take one tablet by mouth daily.       desipramine(+) (NORPRAMIN) 25 mg tablet Take one tablet by mouth twice daily.       esomeprazole DR(+) (NEXIUM) 40 mg capsule TAKE 1 CAPSULE BY MOUTH TWICE DAILY BEFORE MEALS. TAKE ON AN EMPTY STOMACH AT LEAST 1 HOUR BEFORE OR 2 HOURS AFTER FOOD 60 capsule 0     furosemide (LASIX) 20 mg tablet Take one tablet by mouth as Needed.       gabapentin (NEURONTIN) 100 mg capsule Take four capsules by mouth every 8 hours.       methadone (DOLOPHINE; METHADOSE) 10 mg tablet Take one tablet by mouth three times daily.       naloxone (NARCAN) 4 mg/actuation nasal spray Insert one spray into nose as directed. Insert 1 spray into 1 nostril as  needed for signs of opioid overdose then call 911. May repeat dose every 2-3 minutes (alternate nostrils) until medical team arrives.       ondansetron HCL (ZOFRAN) 4 mg tablet Take one tablet by mouth every 8 hours as needed for Nausea or Vomiting.       potassium chloride SR (K-DUR) 20 mEq tablet Take one tablet by mouth daily. Take with a meal and a full glass of water.       promethazine (PHENERGAN) 25 mg tablet Take one tablet by mouth every 6 hours as needed for Nausea or Vomiting.        Allergies   Allergen Reactions    Cymbalta [Duloxetine] NAUSEA ONLY     abdominal pain    Lyrica [Pregabalin] NAUSEA ONLY     abdominal pain        Social History:   Social History     Tobacco Use    Smoking status: Never    Smokeless tobacco: Never   Substance Use Topics    Alcohol use: No     Alcohol/week: 0.0 standard drinks of alcohol      Family History   Problem Relation Name Age of Onset    Diabetes Mother      Arthritis Mother      COPD Mother      Heart Attack Father      Melanoma Neg Hx          Review of Systems  Constitutional: negative for fevers; positive for generalized pain  Respiratory: negative for cough or increased work of breathing  Cardiovascular: negative for chest pain  Gastrointestinal: negative for nausea, vomiting, and abdominal pain    Previous Anesthetic/Sedation History:  Reviewed    Code Status: Full Code    Physical Exam:  Vital Signs: Last Filed In 24 Hours Vital Signs: 24 Hour Range   BP: 151/79 (10/18 0722)  Temp: 36.6 ?C (97.9 ?F) (10/18 2956)  Pulse: 85 (10/18 0722)  Respirations: 13 PER MINUTE (10/18 0722)  SpO2: 100 % (10/18 0722)  O2 Device: None (Room air) (10/18 0722) BP: (151)/(79)   Temp:  [36.6 ?C (97.9 ?F)]   Pulse:  [85]   Respirations:  [13 PER MINUTE]   SpO2:  [100 %]   O2 Device: None (Room air)          General appearance: alert and no distress  Neurologic: Grossly normal, at baseline  Lungs: Nonlabored with normal effort  Abdomen: soft, non-tender.   Extremities: extremities normal, atraumatic, no cyanosis or edema      Airway:  airway assessment performed  Mallampati I (soft palate, uvula, fauces, tonsillar pillars visible)   Anesthesia Classification:  ASA III (A patient with a severe systemic disease that limits activity, but is not incapacitating)  Pre procedure anxiolysis plan: Midazolam  Sedation/Medication Plan: Fentanyl, Lidocaine, and Midazolam  Personal history of sedation complications: Denies adverse event.   Family history of sedation complications: Denies adverse event.   Medications for Reversal: Naloxone and Flumazenil  Discussion/Reviews:  Physician has discussed risks and alternatives of this type of sedation and above planned procedures with patient  NPO Status: Acceptable  Pregnancy Status: N/A      Pt is not on a therapeutic blood thinner    Lab/Radiology/Other Diagnostic Tests:  Labs:  Pertinent labs reviewed           Samson Frederic, APRN-NP  Pager [phone]

## 2022-12-11 ENCOUNTER — Encounter: Admit: 2022-12-11 | Discharge: 2022-12-11 | Payer: MEDICARE

## 2022-12-11 NOTE — Telephone Encounter
Pt called asking for help viewing records on MyChart. Transferred to [employer].

## 2023-05-23 ENCOUNTER — Encounter: Admit: 2023-05-23 | Discharge: 2023-05-23

## 2023-05-23 NOTE — Telephone Encounter
 Patient called to r/s her 05/25/2023 appointment. Please call back at [phone].

## 2023-05-23 NOTE — Telephone Encounter
 Pt called in to  cancel and reschedule Friday's appointment.

## 2023-05-23 NOTE — Telephone Encounter
 Returned pt's call. No answer. Left message advising her 4/4 appt with Chari Manning has been cancelled per her request. Asked for a return call to reschedule. Left name and number.

## 2023-10-23 ENCOUNTER — Encounter: Admit: 2023-10-23 | Discharge: 2023-10-23 | Payer: MEDICARE

## 2023-11-06 NOTE — Progress Notes
 Date of Service: 11/07/2023     Subjective:      History of Present Illness     Holly Dixon is a 73 y.o. female presenting to the hepatology clinic alone today for continued management of biopsy proven NASH with F1 (June 2021, 12/08/22). 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     Please refer to additional hepatology documentation noted on 09/29/19 for additional pertinent history.    [facility]  Since seen in clinic on 11/24/22, patient reports doing fair.     Due to concerns for worsening fibrosis patient underwent repeat liver biopsy on 12/08/22 which showed steatohepatitis with steatosis (70%), rare ballooned hepatocytes and mild lobular inflammation (MAS 5/8), mild zone 3 delicate fibrosis (stage 1A/4).     She underwent an L3-5 spinal fusion ~4 weeks ago and reports some improvement in her leg pain. She has follow-up with her ortho provider on 10/2. There is discussion about possibly undergoing a right knee replacement in the next few months.     She continues to stay active working with her horses. She has made some dietary changes since last seen and has lost 16lb.     Patient denies any alcohol, tobacco, and other substance use.     She denies any abdominal pain. She has frequent nausea, no vomiting. Denies blood in her stool, lower extremity edema, jaundice. She reports her lupus has been flaring recently.    Has had some labs drawn q3 months at PCP Dr. Melville office- we have not received these results.         Recent Labs/imaging:   Pertinent labs were reviewed on initiation of note and below:    Lab Results   Component Value Date    TOTPROT 7.2 11/24/2022    ALBUMIN 4.5 11/24/2022    TOTBILI 0.7 11/24/2022    ALKPHOS 171 (H) 11/24/2022    AST 48 (H) 11/24/2022    ALT 77 (H) 11/24/2022      Lab Results   Component Value Date    NA 142 11/24/2022    K 4.0 11/24/2022    BUN 13 11/24/2022    CR 0.71 11/24/2022      Lab Results   Component Value Date    WBC 8.2 11/24/2022    HGB 15.1 (H) 11/24/2022    PLTCT 296 11/24/2022    INR 1.1 11/24/2022        MELD 3.0: 8 at 11/24/2022  3:02 PM  MELD-Na: 7 at 11/24/2022  3:02 PM  Calculated from:  Serum Creatinine: 0.71 MG/DL (Using min of 1 MG/DL) at 89/06/7973  6:97 PM  Serum Sodium: 142 MMOL/L (Using max of 137 MMOL/L) at 11/24/2022  3:02 PM  Total Bilirubin: 0.7 MG/DL (Using min of 1 MG/DL) at 89/06/7973  6:97 PM  Serum Albumin: 4.5 G/DL (Using max of 3.5 G/DL) at 89/06/7973  6:97 PM  INR(ratio): 1.1 at 11/24/2022  3:02 PM  Age at listing (hypothetical): 72 years  Sex: Female at 11/24/2022  3:02 PM             Most recent imaging on 04/26/2021 showed 2.4 cm hypoechoic lesion in the left liver, demonstrated to be focal fatty infiltration on previous MRI from 05/31/2020, unchanged..     Review of Systems  A complete 10 point review of systems was negative except those listed in the HPI.    Reviewed and  updated active medication and allergy list.      CALCIUM CARBONATE (CALCIUM 600 PO) Take 1,200 mg by mouth daily.    chlorzoxazone(+) (PARAFON FORTE) 500 mg tablet Take one tablet by mouth four times daily as needed for Muscle Cramps.    cholecalciferol (VITAMIN D-3) 1,000 units tablet Take one tablet by mouth daily.    desipramine(+) (NORPRAMIN) 25 mg tablet Take one tablet by mouth twice daily.    esomeprazole  DR(+) (NEXIUM ) 40 mg capsule TAKE 1 CAPSULE BY MOUTH TWICE DAILY BEFORE MEALS. TAKE ON AN EMPTY STOMACH AT LEAST 1 HOUR BEFORE OR 2 HOURS AFTER FOOD    furosemide (LASIX) 20 mg tablet Take one tablet by mouth as Needed.    gabapentin (NEURONTIN) 100 mg capsule Take four capsules by mouth every 8 hours.    methadone (DOLOPHINE; METHADOSE) 10 mg tablet Take one tablet by mouth three times daily.    naloxone (NARCAN) 4 mg/actuation nasal spray Insert one spray into nose as directed. Insert 1 spray into 1 nostril as needed for signs of opioid overdose then call 911. May repeat dose every 2-3 minutes (alternate nostrils) until medical team arrives.    ondansetron HCL (ZOFRAN) 4 mg tablet Take one tablet by mouth every 8 hours as needed for Nausea or Vomiting.    potassium chloride SR (K-DUR) 20 mEq tablet Take one tablet by mouth daily. Take with a meal and a full glass of water.    promethazine (PHENERGAN) 25 mg tablet Take one tablet by mouth every 6 hours as needed for Nausea or Vomiting.           Objective:           Vitals:    11/07/23 1318   BP: 133/69   BP Source: Arm, Right Upper   Pulse: 76   Temp: 36.8 ?C (98.2 ?F)   SpO2: 98%   TempSrc: Oral   PainSc: Eight   Weight: 98 kg (216 lb)   Height: 160 cm (5' 3)               Wt Readings from Last 4 Encounters:   11/07/23 98 kg (216 lb)   11/24/22 105.2 kg (232 lb)   10/19/21 99 kg (218 lb 3.2 oz)   09/29/20 93.4 kg (206 lb)     Body mass index is 38.26 kg/m?SABRA         MELD 3.0: 8 at 11/24/2022  3:02 PM  MELD-Na: 7 at 11/24/2022  3:02 PM  Calculated from:  Serum Creatinine: 0.71 MG/DL (Using min of 1 MG/DL) at 89/06/7973  6:97 PM  Serum Sodium: 142 MMOL/L (Using max of 137 MMOL/L) at 11/24/2022  3:02 PM  Total Bilirubin: 0.7 MG/DL (Using min of 1 MG/DL) at 89/06/7973  6:97 PM  Serum Albumin: 4.5 G/DL (Using max of 3.5 G/DL) at 89/06/7973  6:97 PM  INR(ratio): 1.1 at 11/24/2022  3:02 PM  Age at listing (hypothetical): 72 years  Sex: Female at 11/24/2022  3:02 PM        Physical Exam    Physical Exam    Vitals reviewed.   Constitutional: Well-developed, well-nourished, patient is obese, in no apparent distress.    HEENT: Normocephalic. no scleral icterus.   Cardiac:  Regular rate and rhythm.    Respiratory: Clear to auscultation bilaterally.  No wheezes or rales.  GI:  Abdomen soft, non-distended, non-tender. No overt hepatosplenomegaly.     Skin:  Skin is warm and dry.  Butterfly rash present.  No jaundice. no spider nevi noted.  no palmar erythema.  Peripheral Vascular: trace lower extremity edema.  Musculoskeletal:  ROM intact, no muscle wasting.   Psychiatric: Normal mood and affect. Behavior is normal.  Neuro:  no asterixis, no tremor.           Assessment/Plan/Recommendations    Metabolic Dysfunction Associated Fatty Liver Disease/NASH:    Biopsy 08/19/19 showed steatohepatitis with 60% steatosis, rare ballooned hepatocytes, and lobular inflammation (NAS 4/8). Very minimal zone 3 perisinusoidal delicate fibrosis stage 1A/4).   Due to concerns for worsening fibrosis patient underwent repeat liver biopsy on 12/08/22 which showed steatohepatitis with steatosis (70%), rare ballooned hepatocytes and mild lobular inflammation (MAS 5/8), mild zone 3 delicate fibrosis (stage 1A/4).   We reviewed the role of uncontrolled metabolic risk factors (i.e. hypertension, hyperlipidemia, diabetes, and untreated obstructive sleep apnea) and the negative impact of these disease process with a concurrent diagnosis of NAFLD  We discussed how fat deposits in the liver, how this leads to inflammation, and how chronic inflammation can ultimately lead to scarring and cirrhosis. The long-term complications of fatty liver disease were discussed with the patient, including variable progression to cirrhosis and disease decompensation.   Regarding the management of fatty liver disease, discussed an appropriate diet, exercise and weight loss plan. It is recommended to lose 5% body weight in order to reduce steatosis; and 10% is needed to reduce inflammation associated with NASH.  The patient should exercise regularly 3-4 times per week, with an average of 30-45 minutes per day.   We discussed the implementation of a healthier diet such as Mediterranean style diet that is moderately low in carbohydrates and consists of lean proteins (chicken, fish, [place]). We reviewed the recommendation to reduce the amounts of red meat consumption as it has shown higher rates of organ fat deposition.   Consider the utility of liberalizing coffee and black tea consumption (1-2 cups daily) as some data supports this may slow progression and reverse effects of NASH-related fibrosis.  Recent labs from 08/09/21 show AST 24 ALT 26 alk phos 170   Will update labs today and request labs from PCP office    Transient Elastography (FibroScan):    FibroScan Results (11/24/22):    E (Elasticity) Median: 13.5 kPa   IQR / med.:  12%  CAP Median:  385 dB/m          Lower Extremity Edema:   Patient is currently on 20mg  lasix daily as prescribed by her PCP.   Bilateral lower extremity edema noted to be trace at today's visit.  Continue to follow a sodium restricted (<2g sodium diet).    Lipid Management/Screening:    Goal LDL in those with NALFD/NASH are < 100 mg/dL or 70mg /dL in individuals with known diabetes.  Recommend following with her PCP for further management.     Dietary Counseling:    Discussed above noted recommendations and encouraged continued lifestyle modifications.   Discussed referral to [place] [facility], though this is not ideal as patient is not local. Declined at this time.   Discussed referral to bariatric surgery- patient reports this is not covered by her insurance, declines referral.       Impaired Glucose Tolerance OR Diabetes Management/Screening:   Patient does not have history of diabetes and last A1C is 5.9% in 2023.   Patient has follow-up to discuss potential pre-diabetes diagnosis with her PCP soon.     Bone health:   All patients with liver disease are  at an increased risk for osteopenia/osteoporosis.  We strongly recommend screening for Vitamin D deficiency with aggressive supplementation/replacement, as indicated.   Replacement per CFT protocol for values less than 30ng/mL.  Lab Results   Component Value Date/Time    VITD25 54.5 09/29/2020 11:38 AM      Recommend calcium +Vitamin D supplementation (2000 IU of Vitamin D and 1200mg  Calcium).   Follow up DEXA scans to evaluate for improvement of bone density therapy encouraged.  DEXA scan from 02/08/2016 showed osteopenia  Recommend following with PCP for further management      Vaccinations:  Patient is immune to hepatitis B.  Hepatitis A immunity unknown  Recommend booster series if low/no immunity. May obtain with our office or with PCP office.   Recommend patient receive annual flu vaccination and stay up-to-date on COVID vaccinations. Recommend staying current on pneumonia vaccinations if patient-appropriate.      Health Maintenance & Cancer Screening:  Recommend staying up to date for cancer screening: mammograms/PAP for women, prostate cancer screening for men, colon cancer screening for all.  Continue to follow with PCP for screening.     Routine Health Care in Patient with Chronic Liver Disease:  All patients with liver disease should avoid the use of Non-steroidal Anti-Inflammatory (NSAID) medications as they can cause significant injury to the kidneys in this population.  The preferred analgesic in liver disease is Tylenol, up to 2g daily.  Recommended avoidance of herbal supplements and over the counter herbal remedies due to potential hepatic toxicities.      Sarcopenia/Nutrition:   With chronic liver disease there is a significant risk of protein malnutrition.    Discussed need to change dietary habits to improve overall protein balance including importance of eating between 1.2-1.5g/kg/day lean protein such as plant-based proteins (soy, legumes, nuts), dairy-based proteins (cheese, milk, yogurt), white meat chicken or pork, fish, and eggs.   Continue to follow a sodium restricted (<2g sodium diet), heart-healthy, Mediterranean style diet. It is best to minimize the intake of carbohydrates and sugars, to avoid obesity.   Strongly encourage protein supplements 2-3 times daily (Boost, Ensure, [employer] Milk, etc.) between meals, or 5-6 times daily for meal replacement, to meet protein and caloric intake.  Recommend a bedtime snack with protein and complex carbohydrate to minimize risk of muscle catabolism overnight.      Follow Up:   1 year months in clinic or sooner if needed. The patient is to call our office with any questions or changes to your health condition.     A total of 40 minutes was spent in the following activities: Preparing to see the patient, Obtaining and/or reviewing separately obtained history, Performing a medically appropriate examination and/or evaluation, Counseling and educating the patient/family/caregiver, Ordering medications, tests, or procedures, Documenting clinical information in the electronic or other health record and Independently interpreting results (not separately reported) and communicating results to the patient/family/caregiver.    Patient/patient family reports that all questions have been answered at this time.  No additional pending concerns.    Thank you very much for the opportunity to participate in the care of this patient.  If you have any further questions, please don't hesitate to contact our office.     _____________________________  Asberry Novak, APRN, FNP-C  Hepatology & Liver Transplantation  [facility]  [phone]  Fax: [phone]

## 2023-11-07 ENCOUNTER — Ambulatory Visit: Admit: 2023-11-07 | Discharge: 2023-11-07 | Payer: MEDICARE

## 2023-11-07 ENCOUNTER — Encounter: Admit: 2023-11-07 | Discharge: 2023-11-07 | Payer: MEDICARE

## 2023-11-07 VITALS — BP 133/69 | HR 76 | Temp 98.20000°F | Ht 63.0 in | Wt 216.0 lb

## 2023-11-07 DIAGNOSIS — Z713 Dietary counseling and surveillance: Secondary | ICD-10-CM

## 2023-11-08 ENCOUNTER — Encounter: Admit: 2023-11-08 | Discharge: 2023-11-08 | Payer: MEDICARE

## 2023-11-08 DIAGNOSIS — K7400 Hepatic fibrosis, unspecified: Secondary | ICD-10-CM

## 2023-11-08 DIAGNOSIS — K7581 Nonalcoholic steatohepatitis (NASH): Principal | ICD-10-CM

## 2024-03-03 ENCOUNTER — Encounter: Admit: 2024-03-03 | Discharge: 2024-03-03 | Payer: MEDICARE

## 2024-03-03 NOTE — Telephone Encounter [36]
 Pt called requesting for a referral for a reumatoide artritis doctor. Please call her back.

## 2024-03-03 NOTE — Telephone Encounter [36]
 Returned call- LVM recommending she request the referral to rheumatology as she was seen in 2019 by Dr. Sammi who recommended return to rheumatology clinic if there is evidence of inflammatory arthritis or connective tissue disease, and PCP is following fibromyalgia.  Rheumatology would need notes of current rheumatological care from PCP to proceed.
